# Patient Record
Sex: Female | Born: 1985 | Race: Black or African American | Hispanic: No | Marital: Single | Smoking: Former smoker
Health system: Southern US, Community
[De-identification: ages and names within clinical notes are randomized; demographics above are authoritative.]

## PROBLEM LIST (undated history)

## (undated) ENCOUNTER — Inpatient Hospital Stay (HOSPITAL_COMMUNITY): Payer: Self-pay

## (undated) DIAGNOSIS — D219 Benign neoplasm of connective and other soft tissue, unspecified: Secondary | ICD-10-CM

## (undated) DIAGNOSIS — F32A Depression, unspecified: Secondary | ICD-10-CM

## (undated) DIAGNOSIS — F329 Major depressive disorder, single episode, unspecified: Secondary | ICD-10-CM

## (undated) DIAGNOSIS — O210 Mild hyperemesis gravidarum: Secondary | ICD-10-CM

## (undated) HISTORY — PX: DILATION AND CURETTAGE OF UTERUS: SHX78

## (undated) HISTORY — DX: Major depressive disorder, single episode, unspecified: F32.9

## (undated) HISTORY — PX: THERAPEUTIC ABORTION: SHX798

## (undated) HISTORY — DX: Depression, unspecified: F32.A

---
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
## 2006-01-08 ENCOUNTER — Emergency Department (HOSPITAL_COMMUNITY): Admission: EM | Admit: 2006-01-08 | Discharge: 2006-01-09 | Payer: Self-pay | Admitting: Emergency Medicine

## 2006-01-10 ENCOUNTER — Ambulatory Visit: Payer: Self-pay | Admitting: Family Medicine

## 2006-01-13 ENCOUNTER — Inpatient Hospital Stay (HOSPITAL_COMMUNITY): Admission: AD | Admit: 2006-01-13 | Discharge: 2006-01-13 | Payer: Self-pay | Admitting: Family Medicine

## 2006-01-27 ENCOUNTER — Ambulatory Visit: Payer: Self-pay | Admitting: Family Medicine

## 2006-02-04 ENCOUNTER — Ambulatory Visit: Payer: Self-pay | Admitting: Family Medicine

## 2006-02-04 ENCOUNTER — Encounter (INDEPENDENT_AMBULATORY_CARE_PROVIDER_SITE_OTHER): Payer: Self-pay | Admitting: Specialist

## 2006-02-04 ENCOUNTER — Other Ambulatory Visit: Admission: RE | Admit: 2006-02-04 | Discharge: 2006-02-04 | Payer: Self-pay | Admitting: Family Medicine

## 2006-02-11 ENCOUNTER — Ambulatory Visit: Payer: Self-pay | Admitting: Family Medicine

## 2006-05-04 ENCOUNTER — Ambulatory Visit: Payer: Self-pay | Admitting: Family Medicine

## 2006-05-06 ENCOUNTER — Ambulatory Visit (HOSPITAL_COMMUNITY): Admission: RE | Admit: 2006-05-06 | Discharge: 2006-05-06 | Payer: Self-pay | Admitting: Obstetrics & Gynecology

## 2006-05-12 ENCOUNTER — Ambulatory Visit: Payer: Self-pay | Admitting: Family Medicine

## 2006-05-16 ENCOUNTER — Ambulatory Visit (HOSPITAL_COMMUNITY): Admission: RE | Admit: 2006-05-16 | Discharge: 2006-05-16 | Payer: Self-pay | Admitting: Family Medicine

## 2006-05-19 ENCOUNTER — Ambulatory Visit: Payer: Self-pay | Admitting: Family Medicine

## 2006-05-19 ENCOUNTER — Encounter (INDEPENDENT_AMBULATORY_CARE_PROVIDER_SITE_OTHER): Payer: Self-pay | Admitting: Family Medicine

## 2006-05-19 LAB — CONVERTED CEMR LAB
Antibody Screen: NEGATIVE
Eosinophils Relative: 1 % (ref 0–5)
HCT: 40.2 % (ref 36.0–46.0)
Hemoglobin: 13.4 g/dL (ref 12.0–15.0)
Hepatitis B Surface Ag: NEGATIVE
Lymphocytes Relative: 22 % (ref 12–46)
MCHC: 33.3 g/dL (ref 30.0–36.0)
Monocytes Absolute: 0.4 10*3/uL (ref 0.2–0.7)
Monocytes Relative: 7 % (ref 3–11)
RBC: 4.51 M/uL (ref 3.87–5.11)
RDW: 12.7 % (ref 11.5–14.0)
Rh Type: POSITIVE
hCG, Beta Chain, Quant, S: 42012.2 milliintl units/mL

## 2006-05-27 ENCOUNTER — Inpatient Hospital Stay (HOSPITAL_COMMUNITY): Admission: AD | Admit: 2006-05-27 | Discharge: 2006-05-27 | Payer: Self-pay | Admitting: Obstetrics

## 2006-07-12 ENCOUNTER — Inpatient Hospital Stay (HOSPITAL_COMMUNITY): Admission: AD | Admit: 2006-07-12 | Discharge: 2006-07-12 | Payer: Self-pay | Admitting: Obstetrics

## 2006-07-14 DIAGNOSIS — J45909 Unspecified asthma, uncomplicated: Secondary | ICD-10-CM | POA: Insufficient documentation

## 2007-04-27 ENCOUNTER — Emergency Department (HOSPITAL_COMMUNITY): Admission: EM | Admit: 2007-04-27 | Discharge: 2007-04-27 | Payer: Self-pay | Admitting: Emergency Medicine

## 2007-06-02 ENCOUNTER — Encounter: Payer: Self-pay | Admitting: *Deleted

## 2007-07-05 ENCOUNTER — Inpatient Hospital Stay (HOSPITAL_COMMUNITY): Admission: AD | Admit: 2007-07-05 | Discharge: 2007-07-05 | Payer: Self-pay | Admitting: Obstetrics

## 2007-10-05 ENCOUNTER — Emergency Department (HOSPITAL_COMMUNITY): Admission: EM | Admit: 2007-10-05 | Discharge: 2007-10-05 | Payer: Self-pay | Admitting: Family Medicine

## 2007-12-04 ENCOUNTER — Inpatient Hospital Stay (HOSPITAL_COMMUNITY): Admission: AD | Admit: 2007-12-04 | Discharge: 2007-12-04 | Payer: Self-pay | Admitting: Obstetrics & Gynecology

## 2007-12-06 ENCOUNTER — Inpatient Hospital Stay (HOSPITAL_COMMUNITY): Admission: AD | Admit: 2007-12-06 | Discharge: 2007-12-06 | Payer: Self-pay | Admitting: Family Medicine

## 2007-12-16 ENCOUNTER — Emergency Department (HOSPITAL_COMMUNITY): Admission: EM | Admit: 2007-12-16 | Discharge: 2007-12-16 | Payer: Self-pay | Admitting: Family Medicine

## 2008-04-03 ENCOUNTER — Emergency Department (HOSPITAL_COMMUNITY): Admission: EM | Admit: 2008-04-03 | Discharge: 2008-04-03 | Payer: Self-pay | Admitting: Emergency Medicine

## 2008-07-11 ENCOUNTER — Emergency Department (HOSPITAL_COMMUNITY): Admission: EM | Admit: 2008-07-11 | Discharge: 2008-07-11 | Payer: Self-pay | Admitting: Emergency Medicine

## 2008-07-24 ENCOUNTER — Emergency Department (HOSPITAL_COMMUNITY): Admission: EM | Admit: 2008-07-24 | Discharge: 2008-07-25 | Payer: Self-pay | Admitting: Emergency Medicine

## 2008-10-17 ENCOUNTER — Emergency Department (HOSPITAL_COMMUNITY): Admission: EM | Admit: 2008-10-17 | Discharge: 2008-10-17 | Payer: Self-pay | Admitting: Family Medicine

## 2010-06-07 ENCOUNTER — Encounter: Payer: Self-pay | Admitting: *Deleted

## 2010-06-11 ENCOUNTER — Emergency Department (HOSPITAL_COMMUNITY)
Admission: EM | Admit: 2010-06-11 | Discharge: 2010-06-11 | Payer: Self-pay | Source: Home / Self Care | Admitting: Family Medicine

## 2010-08-27 LAB — URINALYSIS, ROUTINE W REFLEX MICROSCOPIC
Nitrite: NEGATIVE
Specific Gravity, Urine: 1.027 (ref 1.005–1.030)
Urobilinogen, UA: 1 mg/dL (ref 0.0–1.0)
pH: 7 (ref 5.0–8.0)

## 2010-08-27 LAB — URINE MICROSCOPIC-ADD ON

## 2010-08-27 LAB — POCT I-STAT, CHEM 8
BUN: 10 mg/dL (ref 6–23)
Chloride: 104 mEq/L (ref 96–112)
Creatinine, Ser: 1 mg/dL (ref 0.4–1.2)
Glucose, Bld: 87 mg/dL (ref 70–99)
Hemoglobin: 12.6 g/dL (ref 12.0–15.0)
Potassium: 3.8 mEq/L (ref 3.5–5.1)
Sodium: 135 mEq/L (ref 135–145)

## 2010-08-27 LAB — URINE CULTURE: Colony Count: 35000

## 2010-09-30 ENCOUNTER — Inpatient Hospital Stay (INDEPENDENT_AMBULATORY_CARE_PROVIDER_SITE_OTHER)
Admission: RE | Admit: 2010-09-30 | Discharge: 2010-09-30 | Disposition: A | Discharge status: Home / Self Care | Payer: Self-pay | Source: Ambulatory Visit | Attending: Family Medicine | Admitting: Family Medicine

## 2010-09-30 DIAGNOSIS — S335XXA Sprain of ligaments of lumbar spine, initial encounter: Secondary | ICD-10-CM

## 2010-11-11 ENCOUNTER — Inpatient Hospital Stay (INDEPENDENT_AMBULATORY_CARE_PROVIDER_SITE_OTHER)
Admission: RE | Admit: 2010-11-11 | Discharge: 2010-11-11 | Disposition: A | Discharge status: Home / Self Care | Payer: Self-pay | Source: Ambulatory Visit | Attending: Emergency Medicine | Admitting: Emergency Medicine

## 2010-11-11 DIAGNOSIS — T148 Other injury of unspecified body region: Secondary | ICD-10-CM

## 2010-12-11 ENCOUNTER — Emergency Department (HOSPITAL_COMMUNITY)
Admission: EM | Admit: 2010-12-11 | Discharge: 2010-12-11 | Disposition: A | Discharge status: Home / Self Care | Payer: Medicaid Other | Attending: Emergency Medicine | Admitting: Emergency Medicine

## 2010-12-11 DIAGNOSIS — R109 Unspecified abdominal pain: Secondary | ICD-10-CM | POA: Insufficient documentation

## 2010-12-11 DIAGNOSIS — N898 Other specified noninflammatory disorders of vagina: Secondary | ICD-10-CM | POA: Insufficient documentation

## 2010-12-11 DIAGNOSIS — J45909 Unspecified asthma, uncomplicated: Secondary | ICD-10-CM | POA: Insufficient documentation

## 2010-12-11 LAB — URINALYSIS, ROUTINE W REFLEX MICROSCOPIC
Glucose, UA: NEGATIVE mg/dL
Ketones, ur: NEGATIVE mg/dL
Protein, ur: NEGATIVE mg/dL
Urobilinogen, UA: 1 mg/dL (ref 0.0–1.0)

## 2010-12-11 LAB — WET PREP, GENITAL: Yeast Wet Prep HPF POC: NONE SEEN

## 2010-12-11 LAB — URINE MICROSCOPIC-ADD ON

## 2010-12-11 LAB — POCT I-STAT, CHEM 8
BUN: 16 mg/dL (ref 6–23)
Chloride: 107 mEq/L (ref 96–112)
Creatinine, Ser: 1.2 mg/dL — ABNORMAL HIGH (ref 0.50–1.10)
Glucose, Bld: 93 mg/dL (ref 70–99)
HCT: 40 % (ref 36.0–46.0)
Potassium: 4 mEq/L (ref 3.5–5.1)
Sodium: 139 mEq/L (ref 135–145)

## 2010-12-11 LAB — POCT PREGNANCY, URINE: Preg Test, Ur: NEGATIVE

## 2010-12-14 LAB — GC/CHLAMYDIA PROBE AMP, GENITAL
Chlamydia, DNA Probe: UNDETERMINED
GC Probe Amp, Genital: NEGATIVE

## 2011-02-05 LAB — URINALYSIS, ROUTINE W REFLEX MICROSCOPIC
Nitrite: NEGATIVE
Specific Gravity, Urine: 1.02
pH: 8

## 2011-02-12 LAB — URINALYSIS, ROUTINE W REFLEX MICROSCOPIC
Bilirubin Urine: NEGATIVE
Hgb urine dipstick: NEGATIVE
Ketones, ur: NEGATIVE
Nitrite: NEGATIVE
Urobilinogen, UA: 0.2

## 2011-02-12 LAB — POCT PREGNANCY, URINE
Operator id: 129211
Preg Test, Ur: NEGATIVE

## 2011-02-12 LAB — GC/CHLAMYDIA PROBE AMP, GENITAL
Chlamydia, DNA Probe: NEGATIVE
GC Probe Amp, Genital: NEGATIVE

## 2011-02-12 LAB — WET PREP, GENITAL

## 2011-02-22 LAB — POCT PREGNANCY, URINE: Preg Test, Ur: NEGATIVE

## 2011-02-22 LAB — URINE MICROSCOPIC-ADD ON

## 2011-02-22 LAB — URINALYSIS, ROUTINE W REFLEX MICROSCOPIC
Hgb urine dipstick: NEGATIVE
Ketones, ur: NEGATIVE
Protein, ur: NEGATIVE
Urobilinogen, UA: 1

## 2011-05-18 NOTE — L&D Delivery Note (Signed)
Delivery Note At 4:20 PM a viable female was delivered via Vaginal, Spontaneous Delivery (Presentation: ;  ).  APGAR: , ; weight .   Placenta status: , .  Cord:  with the following complications: .  Cord pH: not done  Anesthesia: Epidural  Episiotomy:  Lacerations:  Suture Repair: 2.0 Est. Blood Loss (mL):   Mom to postpartum.  Baby to nursery-stable.  Maryana Pittmon A 04/08/2012, 4:28 PM

## 2011-08-15 ENCOUNTER — Encounter (HOSPITAL_COMMUNITY): Payer: Self-pay | Admitting: *Deleted

## 2011-08-15 ENCOUNTER — Emergency Department (HOSPITAL_COMMUNITY)
Admission: EM | Admit: 2011-08-15 | Discharge: 2011-08-15 | Disposition: A | Discharge status: Home / Self Care | Payer: Self-pay | Source: Home / Self Care | Attending: Emergency Medicine | Admitting: Emergency Medicine

## 2011-08-15 DIAGNOSIS — Z331 Pregnant state, incidental: Secondary | ICD-10-CM

## 2011-08-15 DIAGNOSIS — R1110 Vomiting, unspecified: Secondary | ICD-10-CM

## 2011-08-15 LAB — POCT I-STAT, CHEM 8
Chloride: 105 mEq/L (ref 96–112)
Glucose, Bld: 90 mg/dL (ref 70–99)
HCT: 43 % (ref 36.0–46.0)
Hemoglobin: 14.6 g/dL (ref 12.0–15.0)
Potassium: 4.2 mEq/L (ref 3.5–5.1)
Sodium: 139 mEq/L (ref 135–145)

## 2011-08-15 LAB — POCT PREGNANCY, URINE: Preg Test, Ur: POSITIVE — AB

## 2011-08-15 MED ORDER — PRENATAL AD PO TABS: 1.0000 | ORAL_TABLET | Freq: Every day | ORAL | Status: DC

## 2011-08-15 NOTE — Discharge Instructions (Signed)
AS. Discuss, and to followup with women's clinic for your pregnancy he would also need to have this test was done to recheck your kidney function is to cut discussed as his mildly abnormal. Have encouraged you to drink abundant fluids as I think you're     most likely chronic dehydration has become more important and symptomatic as you are pregnant now and it's imperative that you drink abundant fluids as discussed should also start taking prenatal vitamins as well as possible to establish prenatal care.  If any abdominal pain, recurrent or repeated vomiting, vaginal bleeding or spotting should go to [facility] ABCs of Pregnancy A Antepartum care is very important. Be sure you see your doctor and get prenatal care as soon as you think you are pregnant. At this time, you will be tested for infection, genetic abnormalities and potential problems with you and the pregnancy. This is the time to discuss diet, exercise, work, medications, labor, pain medication during labor and the possibility of a cesarean delivery. Ask any questions that may concern you. It is important to see your doctor regularly throughout your pregnancy. Avoid exposure to toxic substances and chemicals - such as cleaning solvents, lead and mercury, some insecticides, and paint. Pregnant women should avoid exposure to paint fumes, and fumes that cause you to feel ill, dizzy or faint. When possible, it is a good idea to have a pre-pregnancy consultation with your caregiver to begin some important recommendations your caregiver suggests such as, taking folic acid, exercising, quitting smoking, avoiding alcoholic beverages, etc. B Breastfeeding is the healthiest choice for both you and your baby. It has many nutritional benefits for the baby and health benefits for the mother. It also creates a very tight and loving bond between the baby and mother. Talk to your doctor, your family and friends, and your employer about how you choose to  feed your baby and how they can support you in your decision. Not all birth defects can be prevented, but a woman can take actions that may increase her chance of having a healthy baby. Many birth defects happen very early in pregnancy, sometimes before a woman even knows she is pregnant. Birth defects or abnormalities of any child in your or the father's family should be discussed with your caregiver. Get a good support bra as your breast size changes. Wear it especially when you exercise and when nursing.  C Celebrate the news of your pregnancy with the your spouse/father and family. Childbirth classes are helpful to take for you and the spouse/father because it helps to understand what happens during the pregnancy, labor and delivery. Cesarean delivery should be discussed with your doctor so you are prepared for that possibility. The pros and cons of circumcision if it is a boy, should be discussed with your pediatrician. Cigarette smoking during pregnancy can result in low birth weight babies. It has been associated with infertility, miscarriages, tubal pregnancies, infant death (mortality) and poor health (morbidity) in childhood. Additionally, cigarette smoking may cause long-term learning disabilities. If you smoke, you should try to quit before getting pregnant and not smoke during the pregnancy. Secondary smoke may also harm a mother and her developing baby. It is a good idea to ask people to stop smoking around you during your pregnancy and after the baby is born. Extra calcium is necessary when you are pregnant and is found in your prenatal vitamin, in dairy products, green leafy vegetables and in calcium supplements. D A healthy diet according to  your current weight and height, along with vitamins and mineral supplements should be discussed with your caregiver. Domestic abuse or violence should be made known to your doctor right away to get the situation corrected. Drink more water when you exercise  to keep hydrated. Discomfort of your back and legs usually develops and progresses from the middle of the second trimester through to delivery of the baby. This is because of the enlarging baby and uterus, which may also affect your balance. Do not take illegal drugs. Illegal drugs can seriously harm the baby and you. Drink extra fluids (water is best) throughout pregnancy to help your body keep up with the increases in your blood volume. Drink at least 6 to 8 glasses of water, fruit juice, or milk each day. A good way to know you are drinking enough fluid is when your urine looks almost like clear water or is very light yellow.  E Eat healthy to get the nutrients you and your unborn baby need. Your meals should include the five basic food groups. Exercise (30 minutes of light to moderate exercise a day) is important and encouraged during pregnancy, if there are no medical problems or problems with the pregnancy. Exercise that causes discomfort or dizziness should be stopped and reported to your caregiver. Emotions during pregnancy can change from being ecstatic to depression and should be understood by you, your partner and your family. F Fetal screening with ultrasound, amniocentesis and monitoring during pregnancy and labor is common and sometimes necessary. Take 400 micrograms of folic acid daily both before, when possible, and during the first few months of pregnancy to reduce the risk of birth defects of the brain and spine. All women who could possibly become pregnant should take a vitamin with folic acid, every day. It is also important to eat a healthy diet with fortified foods (enriched grain products, including cereals, rice, breads, and pastas) and foods with natural sources of folate (orange juice, green leafy vegetables, beans, peanuts, broccoli, asparagus, peas, and lentils). The father should be involved with all aspects of the pregnancy including, the prenatal care, childbirth classes, labor,  delivery, and postpartum time. Fathers may also have emotional concerns about being a father, financial needs, and raising a family. G Genetic testing should be done appropriately. It is important to know your family and the father's history. If there have been problems with pregnancies or birth defects in your family, report these to your doctor. Also, genetic counselors can talk with you about the information you might need in making decisions about having a family. You can call a major medical center in your area for help in finding a board-certified genetic counselor. Genetic testing and counseling should be done before pregnancy when possible, especially if there is a history of problems in the mother's or father's family. Certain ethnic backgrounds are more at risk for genetic defects. H Get familiar with the hospital where you will be having your baby. Get to know how long it takes to get there, the labor and delivery area, and the hospital procedures. Be sure your medical insurance is accepted there. Get your home ready for the baby including, clothes, the baby's room (when possible), furniture and car seat. Hand washing is important throughout the day, especially after handling raw meat and poultry, changing the baby's diaper or using the bathroom. This can help prevent the spread of many bacteria and viruses that cause infection. Your hair may become dry and thinner, but will return to normal  a few weeks after the baby is born. Heartburn is a common problem that can be treated by taking antacids recommended by your caregiver, eating smaller meals 5 or 6 times a day, not drinking liquids when eating, drinking between meals and raising the head of your bed 2 to 3 inches. I Insurance to cover you, the baby, doctor and hospital should be reviewed so that you will be prepared to pay any costs not covered by your insurance plan. If you do not have medical insurance, there are usually clinics and services  available for you in your community. Take 30 milligrams of iron during your pregnancy as prescribed by your doctor to reduce the risk of low red blood cells (anemia) later in pregnancy. All women of childbearing age should eat a diet rich in iron. J There should be a joint effort for the mother, father and any other children to adapt to the pregnancy financially, emotionally, and psychologically during the pregnancy. Join a support group for moms-to-be. Or, join a class on parenting or childbirth. Have the family participate when possible. K Know your limits. Let your caregiver know if you experience any of the following:   Pain of any kind.   Strong cramps.   You develop a lot of weight in a short period of time (5 pounds in 3 to 5 days).   Vaginal bleeding, leaking of amniotic fluid.   Headache, vision problems.   Dizziness, fainting, shortness of breath.   Chest pain.   Fever of 102 F (38.9 C) or higher.   Gush of clear fluid from your vagina.   Painful urination.   Domestic violence.   Irregular heartbeat (palpitations).   Rapid beating of the heart (tachycardia).   Constant feeling sick to your stomach (nauseous) and vomiting.   Trouble walking, fluid retention (edema).   Muscle weakness.   If your baby has decreased activity.   Persistent diarrhea.   Abnormal vaginal discharge.   Uterine contractions at 20-minute intervals.   Back pain that travels down your leg.  L Learn and practice that what you eat and drink should be in moderation and healthy for you and your baby. Legal drugs such as alcohol and caffeine are important issues for pregnant women. There is no safe amount of alcohol a woman can drink while pregnant. Fetal alcohol syndrome, a disorder characterized by growth retardation, facial abnormalities, and central nervous system dysfunction, is caused by a woman's use of alcohol during pregnancy. Caffeine, found in tea, coffee, soft drinks and  chocolate, should also be limited. Be sure to read labels when trying to cut down on caffeine during pregnancy. More than 200 foods, beverages, and over-the-counter medications contain caffeine and have a high salt content! There are coffees and teas that do not contain caffeine. M Medical conditions such as diabetes, epilepsy, and high blood pressure should be treated and kept under control before pregnancy when possible, but especially during pregnancy. Ask your caregiver about any medications that may need to be changed or adjusted during pregnancy. If you are currently taking any medications, ask your caregiver if it is safe to take them while you are pregnant or before getting pregnant when possible. Also, be sure to discuss any herbs or vitamins you are taking. They are medicines, too! Discuss with your doctor all medications, prescribed and over-the-counter, that you are taking. During your prenatal visit, discuss the medications your doctor may give you during labor and delivery. N Never be afraid to ask your  doctor or caregiver questions about your health, the progress of the pregnancy, family problems, stressful situations, and recommendation for a pediatrician, if you do not have one. It is better to take all precautions and discuss any questions or concerns you may have during your office visits. It is a good idea to write down your questions before you visit the doctor. O Over-the-counter cough and cold remedies may contain alcohol or other ingredients that should be avoided during pregnancy. Ask your caregiver about prescription, herbs or over-the-counter medications that you are taking or may consider taking while pregnant.  P Physical activity during pregnancy can benefit both you and your baby by lessening discomfort and fatigue, providing a sense of well-being, and increasing the likelihood of early recovery after delivery. Light to moderate exercise during pregnancy strengthens the belly  (abdominal) and back muscles. This helps improve posture. Practicing yoga, walking, swimming, and cycling on a stationary bicycle are usually safe exercises for pregnant women. Avoid scuba diving, exercise at high altitudes (over 3000 feet), skiing, horseback riding, contact sports, etc. Always check with your doctor before beginning any kind of exercise, especially during pregnancy and especially if you did not exercise before getting pregnant. Q Queasiness, stomach upset and morning sickness are common during pregnancy. Eating a couple of crackers or dry toast before getting out of bed. Foods that you normally love may make you feel sick to your stomach. You may need to substitute other nutritious foods. Eating 5 or 6 small meals a day instead of 3 large ones may make you feel better. Do not drink with your meals, drink between meals. Questions that you have should be written down and asked during your prenatal visits. R Read about and make plans to baby-proof your home. There are important tips for making your home a safer environment for your baby. Review the tips and make your home safer for you and your baby. Read food labels regarding calories, salt and fat content in the food. S Saunas, hot tubs, and steam rooms should be avoided while you are pregnant. Excessive high heat may be harmful during your pregnancy. Your caregiver will screen and examine you for sexually transmitted diseases and genetic disorders during your prenatal visits. Learn the signs of labor. Sexual relations while pregnant is safe unless there is a medical or pregnancy problem and your caregiver advises against it. T Traveling long distances should be avoided especially in the third trimester of your pregnancy. If you do have to travel out of state, be sure to take a copy of your medical records and medical insurance plan with you. You should not travel long distances without seeing your doctor first. Most airlines will not allow  you to travel after 36 weeks of pregnancy. Toxoplasmosis is an infection caused by a parasite that can seriously harm an unborn baby. Avoid eating undercooked meat and handling cat litter. Be sure to wear gloves when gardening. Tingling of the hands and fingers is not unusual and is due to fluid retention. This will go away after the baby is born. U Womb (uterus) size increases during the first trimester. Your kidneys will begin to function more efficiently. This may cause you to feel the need to urinate more often. You may also leak urine when sneezing, coughing or laughing. This is due to the growing uterus pressing against your bladder, which lies directly in front of and slightly under the uterus during the first few months of pregnancy. If you experience burning along with  frequency of urination or bloody urine, be sure to tell your doctor. The size of your uterus in the third trimester may cause a problem with your balance. It is advisable to maintain good posture and avoid wearing high heels during this time. An ultrasound of your baby may be necessary during your pregnancy and is safe for you and your baby. V Vaccinations are an important concern for pregnant women. Get needed vaccines before pregnancy. Center for Disease Control ([employer]) has clear guidelines for the use of vaccines during pregnancy. Review the list, be sure to discuss it with your doctor. Prenatal vitamins are helpful and healthy for you and the baby. Do not take extra vitamins except what is recommended. Taking too much of certain vitamins can cause overdose problems. Continuous vomiting should be reported to your caregiver. Varicose veins may appear especially if there is a family history of varicose veins. They should subside after the delivery of the baby. Support hose helps if there is leg discomfort. W Being overweight or underweight during pregnancy may cause problems. Try to get within 15 pounds of your ideal weight  before pregnancy. Remember, pregnancy is not a time to be dieting! Do not stop eating or start skipping meals as your weight increases. Both you and your baby need the calories and nutrition you receive from a healthy diet. Be sure to consult with your doctor about your diet. There is a formula and diet plan available depending on whether you are overweight or underweight. Your caregiver or nutritionist can help and advise you if necessary. X Avoid X-rays. If you must have dental work or diagnostic tests, tell your dentist or physician that you are pregnant so that extra care can be taken. X-rays should only be taken when the risks of not taking them outweigh the risk of taking them. If needed, only the minimum amount of radiation should be used. When X-rays are necessary, protective lead shields should be used to cover areas of the body that are not being X-rayed. Y Your baby loves you. Breastfeeding your baby creates a loving and very close bond between the two of you. Give your baby a healthy environment to live in while you are pregnant. Infants and children require constant care and guidance. Their health and safety should be carefully watched at all times. After the baby is born, rest or take a nap when the baby is sleeping. Z Get your ZZZs. Be sure to get plenty of rest. Resting on your side as often as possible, especially on your left side is advised. It provides the best circulation to your baby and helps reduce swelling. Try taking a nap for 30 to 45 minutes in the afternoon when possible. After the baby is born rest or take a nap when the baby is sleeping. Try elevating your feet for that amount of time when possible. It helps the circulation in your legs and helps reduce swelling.  Most information courtesy of the CDC. Document Released: 05/03/2005 Document Revised: 04/22/2011 Document Reviewed: 01/15/2009 [facility] Patient Information 2012 [place], [place].

## 2011-08-15 NOTE — ED Notes (Signed)
Pt with c/o headaches onset yesterday - intermittent headache - felt balance off last night - onset of vomiting this am - vomited x 2

## 2011-08-15 NOTE — ED Provider Notes (Addendum)
History     CSN: [id number]  Arrival date & time 08/15/11  1735   First MD Initiated Contact with Patient 08/15/11 1747      Chief Complaint  Patient presents with  . Headache    (Consider location/radiation/quality/duration/timing/severity/associated sxs/prior treatment) HPI Comments: Patient presents to the urgent care since yesterday she's been having a headache and also refill a bit dizzy especially when she stands up suddenly. Have also vomited 2 times in the last 24 hours. Patient denies any other symptoms such as pelvic pain, vaginal bleeding   Patient is a 26 y.o. female presenting with headaches. The history is provided by the patient.  Headache The primary symptoms include headaches, dizziness, nausea and vomiting. Primary symptoms do not include syncope, loss of consciousness, altered mental status, visual change, paresthesias, memory loss or fever. The symptoms began 2 days ago. The symptoms are unchanged.  The headache is not associated with visual change or paresthesias.  Dizziness also occurs with nausea and vomiting.    Past Medical History  Diagnosis Date  . Asthma     History reviewed. No pertinent past surgical history.  History reviewed. No pertinent family history.  History  Substance Use Topics  . Smoking status: Never Smoker   . Smokeless tobacco: Not on file  . Alcohol Use: No    OB History    Grav Para Term Preterm Abortions TAB SAB Ect Mult Living                  Review of Systems  Constitutional: Positive for appetite change and fatigue. Negative for fever.  Cardiovascular: Negative for syncope.  Gastrointestinal: Positive for nausea and vomiting.  Genitourinary: Negative for dysuria, vaginal bleeding, menstrual problem and pelvic pain.  Neurological: Positive for dizziness and headaches. Negative for loss of consciousness and paresthesias.  Psychiatric/Behavioral: Negative for memory loss and altered mental status.    Allergies    Penicillins and Tylenol  Home Medications   Current Outpatient Rx  Name Route Sig Dispense Refill  . ALBUTEROL SULFATE HFA 108 (90 BASE) MCG/ACT IN AERS Inhalation Inhale 2 puffs into the lungs every 6 (six) hours as needed.    . IBUPROFEN 200 MG PO TABS Oral Take 400 mg by mouth every 6 (six) hours as needed.      BP 112/67  Pulse 74  Temp(Src) 98.7 F (37.1 C) (Oral)  Resp 18  SpO2 98%  LMP 07/06/2011  Physical Exam  Nursing note and vitals reviewed. Constitutional: She appears well-developed and well-nourished. No distress.  Cardiovascular: Normal rate.   Pulmonary/Chest: Effort normal and breath sounds normal. No respiratory distress.  Abdominal: Bowel sounds are normal. She exhibits no distension. There is no rebound and no guarding.  Skin: No rash noted.    ED Course  Procedures (including critical care time)  Labs Reviewed  POCT PREGNANCY, URINE - Abnormal; Notable for the following:    Preg Test, Ur POSITIVE (*)    All other components within normal limits  POCT I-STAT, CHEM 8 - Abnormal; Notable for the following:    Creatinine, Ser 1.20 (*)    All other components within normal limits   No results found.   No diagnosis found.    MDM  Patient complains of her headache since yesterday was feeling a little bit lightheaded had one episode of vomiting this a.m. Patient is pregnant exam is remarkable otherwise        Jimmie Molly, MD 08/15/11 1906  Jimmie Molly, MD 08/15/11  1907 

## 2011-08-19 ENCOUNTER — Inpatient Hospital Stay (HOSPITAL_COMMUNITY): Payer: Medicaid Other

## 2011-08-19 ENCOUNTER — Inpatient Hospital Stay (HOSPITAL_COMMUNITY)
Admission: AD | Admit: 2011-08-19 | Discharge: 2011-08-19 | Disposition: A | Discharge status: Home / Self Care | Payer: Medicaid Other | Source: Ambulatory Visit | Attending: Obstetrics & Gynecology | Admitting: Obstetrics & Gynecology

## 2011-08-19 ENCOUNTER — Encounter (HOSPITAL_COMMUNITY): Payer: Self-pay

## 2011-08-19 DIAGNOSIS — O210 Mild hyperemesis gravidarum: Secondary | ICD-10-CM | POA: Insufficient documentation

## 2011-08-19 HISTORY — DX: Mild hyperemesis gravidarum: O21.0

## 2011-08-19 LAB — DIFFERENTIAL
Basophils Absolute: 0 10*3/uL (ref 0.0–0.1)
Eosinophils Absolute: 0 10*3/uL (ref 0.0–0.7)
Eosinophils Relative: 0 % (ref 0–5)
Lymphocytes Relative: 23 % (ref 12–46)
Monocytes Absolute: 0.6 10*3/uL (ref 0.1–1.0)

## 2011-08-19 LAB — URINALYSIS, ROUTINE W REFLEX MICROSCOPIC
Leukocytes, UA: NEGATIVE
Nitrite: NEGATIVE
Specific Gravity, Urine: >1.03 — ABNORMAL HIGH (ref 1.005–1.030)
Urobilinogen, UA: 1 mg/dL (ref 0.0–1.0)
pH: 6 (ref 5.0–8.0)

## 2011-08-19 LAB — URINE MICROSCOPIC-ADD ON

## 2011-08-19 LAB — WET PREP, GENITAL
Trich, Wet Prep: NONE SEEN
Yeast Wet Prep HPF POC: NONE SEEN

## 2011-08-19 LAB — CBC
HCT: 42.1 % (ref 36.0–46.0)
MCH: 30.7 pg (ref 26.0–34.0)
MCV: 89.2 fL (ref 78.0–100.0)
Platelets: 268 10*3/uL (ref 150–400)
RDW: 11.8 % (ref 11.5–15.5)
WBC: 8.1 10*3/uL (ref 4.0–10.5)

## 2011-08-19 LAB — HCG, QUANTITATIVE, PREGNANCY: hCG, Beta Chain, Quant, S: 17595 m[IU]/mL — ABNORMAL HIGH (ref <5)

## 2011-08-19 MED ORDER — LACTATED RINGERS IV BOLUS (SEPSIS)
1000.0000 mL | Freq: Once | INTRAVENOUS | Status: AC
  Administered 2011-08-19: 1000 mL via INTRAVENOUS

## 2011-08-19 MED ORDER — PROMETHAZINE HCL 25 MG/ML IJ SOLN
12.5000 mg | Freq: Once | INTRAMUSCULAR | Status: AC
  Administered 2011-08-19: 12.5 mg via INTRAVENOUS
  Filled 2011-08-19: qty 1

## 2011-08-19 MED ORDER — ONDANSETRON HCL 4 MG/2ML IJ SOLN
4.0000 mg | Freq: Once | INTRAMUSCULAR | Status: AC
  Administered 2011-08-19: 4 mg via INTRAVENOUS
  Filled 2011-08-19: qty 2

## 2011-08-19 MED ORDER — PROMETHAZINE HCL 25 MG RE SUPP: 25.0000 mg | Freq: Four times a day (QID) | RECTAL | Status: DC | PRN

## 2011-08-19 MED ORDER — PROMETHAZINE HCL 25 MG PO TABS: 12.5000 mg | ORAL_TABLET | Freq: Four times a day (QID) | ORAL | Status: DC | PRN

## 2011-08-19 MED ORDER — THIAMINE HCL 100 MG/ML IJ SOLN
Freq: Once | INTRAVENOUS | Status: AC
  Administered 2011-08-19: 15:00:00 via INTRAVENOUS
  Filled 2011-08-19: qty 1000

## 2011-08-19 NOTE — MAU Note (Signed)
No adverse effect from phenergan, still feeling slight nausea. Pt resting.

## 2011-08-19 NOTE — MAU Note (Signed)
Patient states she has had nausea and vomiting for 4 days, for the past 3 has been unable to keep anything down. Lower abdominal cramping. Had a positive pregnancy test at Urgent Care on 3-31.

## 2011-08-19 NOTE — MAU Provider Note (Signed)
History     CSN: [id number]  Arrival date & time 08/19/11  1020   None     Chief Complaint  Patient presents with  . Emesis During Pregnancy    HPI Jean Ali is a 26 y.o. female @ [redacted]w[redacted]d gestation who presents to MAU for nausea and vomiting. Unable to keep anything down for the past 4 days. Occasional low abdominal cramping. The history was provided by the patient.  Past Medical History  Diagnosis Date  . Asthma   . Hyperemesis gravidarum     Past Surgical History  Procedure Date  . Dilation and curettage of uterus     Family History  Problem Relation Age of Onset  . Anesthesia problems Neg Hx   . Hypotension Neg Hx   . Malignant hyperthermia Neg Hx   . Pseudochol deficiency Neg Hx     History  Substance Use Topics  . Smoking status: Former Games developer  . Smokeless tobacco: Never Used  . Alcohol Use: No     stopped once she found out she was pregnant    OB History    Grav Para Term Preterm Abortions TAB SAB Ect Mult Living   4 1 1  2 2    1       Review of Systems  Constitutional: Positive for appetite change and fatigue. Negative for fever and chills.  Eyes: Negative.   Respiratory: Negative.   Cardiovascular: Negative.   Gastrointestinal: Positive for nausea, vomiting, abdominal pain and constipation. Negative for diarrhea.  Genitourinary: Positive for vaginal discharge. Negative for dysuria, urgency, frequency, vaginal bleeding, difficulty urinating and pelvic pain.  Musculoskeletal: Negative for back pain.  Skin: Negative.   Neurological: Positive for headaches. Negative for dizziness.  Psychiatric/Behavioral: Negative for confusion. The patient is not nervous/anxious.     Allergies  Penicillins and Tylenol  Home Medications   Current Outpatient Rx  Name Route Sig Dispense Refill  . PROMETHAZINE HCL 25 MG RE SUPP Rectal Place 1 suppository (25 mg total) rectally every 6 (six) hours as needed for nausea. 12 each 0  . PROMETHAZINE HCL 25 MG PO  TABS Oral Take 0.5 tablets (12.5 mg total) by mouth every 6 (six) hours as needed for nausea. 20 tablet 0    BP 127/72  Pulse 82  Temp(Src) 98.5 F (36.9 C) (Oral)  Resp 16  Ht 5\' 5"  (1.651 m)  Wt 135 lb (61.236 kg)  BMI 22.47 kg/m2  SpO2 100%  LMP 07/06/2011  Physical Exam  Nursing note and vitals reviewed. Constitutional: She is oriented to person, place, and time. She appears well-developed and well-nourished. No distress.  HENT:  Head: Normocephalic.  Eyes: EOM are normal.  Neck: Neck supple.  Cardiovascular: Normal rate.   Pulmonary/Chest: Effort normal.  Abdominal: Soft. There is no tenderness.  Genitourinary:       External genitalia without lesions. White discharge vaginal vault, Cervix closed, no CMT, no adnexal tenderness. Uterus slightly enlarged.  Musculoskeletal: Normal range of motion.  Neurological: She is alert and oriented to person, place, and time. No cranial nerve deficit.  Skin: Skin is warm and dry.  Psychiatric: She has a normal mood and affect. Her behavior is normal. Judgment and thought content normal.   Results for orders placed during the hospital encounter of 08/19/11 (from the past 24 hour(s))  URINALYSIS, ROUTINE W REFLEX MICROSCOPIC     Status: Abnormal   Collection Time   08/19/11 10:25 AM      Component Value Range  Color, Urine YELLOW  YELLOW    APPearance CLEAR  CLEAR    Specific Gravity, Urine >1.030 (*) 1.005 - 1.030    pH 6.0  5.0 - 8.0    Glucose, UA NEGATIVE  NEGATIVE (mg/dL)   Hgb urine dipstick NEGATIVE  NEGATIVE    Bilirubin Urine NEGATIVE  NEGATIVE    Ketones, ur NEGATIVE  NEGATIVE (mg/dL)   Protein, ur 30 (*) NEGATIVE (mg/dL)   Urobilinogen, UA 1.0  0.0 - 1.0 (mg/dL)   Nitrite NEGATIVE  NEGATIVE    Leukocytes, UA NEGATIVE  NEGATIVE   URINE MICROSCOPIC-ADD ON     Status: Normal   Collection Time   08/19/11 10:25 AM      Component Value Range   Squamous Epithelial / LPF RARE  RARE    WBC, UA 3-6  <3 (WBC/hpf)   RBC / HPF  0-2  <3 (RBC/hpf)   Bacteria, UA RARE  RARE    Urine-Other MUCOUS PRESENT    CBC     Status: Normal   Collection Time   08/19/11 11:51 AM      Component Value Range   WBC 8.1  4.0 - 10.5 (K/uL)   RBC 4.72  3.87 - 5.11 (MIL/uL)   Hemoglobin 14.5  12.0 - 15.0 (g/dL)   HCT 14.7  82.9 - 56.2 (%)   MCV 89.2  78.0 - 100.0 (fL)   MCH 30.7  26.0 - 34.0 (pg)   MCHC 34.4  30.0 - 36.0 (g/dL)   RDW 13.0  86.5 - 78.4 (%)   Platelets 268  150 - 400 (K/uL)  DIFFERENTIAL     Status: Normal   Collection Time   08/19/11 11:51 AM      Component Value Range   Neutrophils Relative 70  43 - 77 (%)   Neutro Abs 5.7  1.7 - 7.7 (K/uL)   Lymphocytes Relative 23  12 - 46 (%)   Lymphs Abs 1.8  0.7 - 4.0 (K/uL)   Monocytes Relative 7  3 - 12 (%)   Monocytes Absolute 0.6  0.1 - 1.0 (K/uL)   Eosinophils Relative 0  0 - 5 (%)   Eosinophils Absolute 0.0  0.0 - 0.7 (K/uL)   Basophils Relative 0  0 - 1 (%)   Basophils Absolute 0.0  0.0 - 0.1 (K/uL)  HCG, QUANTITATIVE, PREGNANCY     Status: Abnormal   Collection Time   08/19/11 11:51 AM      Component Value Range   hCG, Beta Chain, Quant, S 17595 (*) <5 (mIU/mL)  ABO/RH     Status: Normal   Collection Time   08/19/11 11:51 AM      Component Value Range   ABO/RH(D) O POS    WET PREP, GENITAL     Status: Abnormal   Collection Time   08/19/11  4:25 PM      Component Value Range   Yeast Wet Prep HPF POC NONE SEEN  NONE SEEN    Trich, Wet Prep NONE SEEN  NONE SEEN    Clue Cells Wet Prep HPF POC MODERATE (*) NONE SEEN    WBC, Wet Prep HPF POC FEW (*) NONE SEEN     Ultrasound shows a 6 week 4 day IUP with YS and FP.  ED Course: IV hydration and antiemetics  Procedures  Patient feeling better after fluids, taking po fluids without difficulty.   Assessment: Hyperemesis  Plan:  Phenergan Supp. Rx   Phenergan Tablet Rx   Start  prenatal care   Return as needed MDM

## 2011-08-19 NOTE — MAU Note (Signed)
Pt still feeling nauseated.

## 2011-08-19 NOTE — MAU Note (Signed)
No adverse effect from zofran, slight relief in nausea.

## 2011-08-19 NOTE — Discharge Instructions (Signed)
  ________________________________________     To schedule your Maternity Eligibility Appointment, please call [phone].  When you arrive for your appointment you must bring the following items or information listed below.  Your appointment will be rescheduled if you do not have these items or are 15 minutes late. If currently receiving Medicaid, you MUST bring: 1. Medicaid Card 2. Social Security Card 3. Picture ID 4. Proof of Pregnancy 5. Verification of current address if the address on Medicaid card is incorrect "postmarked mail" If not receiving Medicaid, you MUST bring: 1. Social Security Card 2. Picture ID 3. Birth Certificate (if available) Passport or *Green Card 4. Proof of Pregnancy 5. Verification of current address "postmarked mail" for each income presented. 6. Verification of insurance coverage, if any 7. Check stubs from each employer for the previous month (if unable to present check stub  for each week, we will accept check stub for the first and last week ill the same month.) If you can't locate check stubs, you must bring a letter from the employer(s) and it must have the following information on letterhead, typed, in English: o name of company o company telephone number o how long been with the company, if less than one month o how much person earns per hour o how many hours per week work o the gross pay the person earned for the previous month If you are 25 years old or less, you do not have to bring proof of income unless you work or live with the father of the baby and at that time we will need proof of income from you and/or the father of the baby. Green Card recipients are eligible for Medicaid for Pregnant Women (MPW)     Hyperemesis Gravidarum Diet Hyperemesis gravidarum is a severe form of morning sickness. It is characterized by frequent and severe vomiting. It happens during the first trimester of pregnancy. It may be caused by the rapid hormone  changes that happen during pregnancy. It is associated with a 5% weight loss of pre-pregnancy weight. The hyperemesis diet may be used to lessen symptoms of nausea and vomiting. EATING GUIDELINES  Eat 5 to 6 small meals daily instead of 3 large meals.   Avoid foods with strong smells.   Avoid drinking 30 minutes before and after meals.   Avoid fried or high-fat foods, such as butter and cream sauces.   Starchy foods are usually well-tolerated, such as cereal, toast, bread, potatoes, pasta, rice, and pretzels.   Eat crackers before you get out of bed in the morning.   Avoid spicy foods.   Ginger may help with nausea. Add  tsp ginger to hot tea or choose ginger tea.   Continue to take your prenatal vitamins as directed by your caregiver.  SAMPLE MEAL PLAN Breakfast    cup oatmeal   1 slice toast   1 tsp heart-healthy margarine   1 tsp jelly   1 scrambled egg  Midmorning Snack   1 cup low-fat yogurt  Lunch   Plain ham sandwich   Carrot or celery sticks   1 small apple   3 graham crackers  Midafternoon Snack   Cheese and crackers  Dinner  4 oz pork tenderloin   1 small baked potato   1 tsp margarine    cup broccoli    cup grapes  Evening Snack  1 cup pudding  Document Released: 02/28/2007 Document Revised: 04/22/2011 Document Reviewed: 08/28/2010 [employer] Patient Information 2012 [employer].Hyperemesis Gravidarum Hyperemesis   gravidarum is a severe form of nausea and vomiting that happens during pregnancy. Hyperemesis is worse than morning sickness. It may cause a woman to have nausea or vomiting all day for many days. It may keep a woman from eating and drinking enough food and liquids. Hyperemesis usually occurs during the first half (the first 20 weeks) of pregnancy. It often goes away once a woman is in her second half of pregnancy. However, sometimes hyperemesis continues through an entire pregnancy.  CAUSES  The cause of this condition is not  completely known but is thought to be due to changes in the body's hormones when pregnant. It could be the high level of the pregnancy hormone or an increase in estrogen in the body.  SYMPTOMS   Severe nausea and vomiting.   Nausea that does not go away.   Vomiting that does not allow you to keep any food down.   Weight loss and body fluid loss (dehydration).   Having no desire to eat or not liking food you have previously enjoyed.  DIAGNOSIS  Your caregiver may ask you about your symptoms. Your caregiver may also order blood tests and urine tests to make sure something else is not causing the problem.  TREATMENT  You may only need medicine to control the problem. If medicines do not control the nausea and vomiting, you will be treated in the hospital to prevent dehydration, acidosis, weight loss, and changes in the electrolytes in your body that may harm the unborn baby (fetus). You may need intravenous (IV) fluids.  HOME CARE INSTRUCTIONS   Take all medicine as directed by your caregiver.   Try eating a couple of dry crackers or toast in the morning before getting out of bed.   Avoid foods and smells that upset your stomach.   Avoid fatty and spicy foods. Eat 5 to 6 small meals a day.   Do not drink when eating meals. Drink between meals.   For snacks, eat high protein foods, such as cheese. Eat or suck on things that have ginger in them. Ginger helps nausea.   Avoid food preparation. The smell of food can spoil your appetite.   Avoid iron pills and iron in your multivitamins until after 3 to 4 months of being pregnant.  SEEK MEDICAL CARE IF:   Your abdominal pain increases since the last time you saw your caregiver.   You have a severe headache.   You develop vision problems.   You feel you are losing weight.  SEEK IMMEDIATE MEDICAL CARE IF:   You are unable to keep fluids down.   You vomit blood.   You have constant nausea and vomiting.   You have a fever.    You have excessive weakness, dizziness, fainting, or extreme thirst.  MAKE SURE YOU:   Understand these instructions.   Will watch your condition.   Will get help right away if you are not doing well or get worse.  Document Released: 05/03/2005 Document Revised: 04/22/2011 Document Reviewed: 08/03/2010 Exit[employer] Patient Information 2012 [employer]. 

## 2011-08-19 NOTE — MAU Note (Signed)
Pt states was seen at Urgent Care, and told she was pregnant.

## 2011-08-29 ENCOUNTER — Inpatient Hospital Stay (HOSPITAL_COMMUNITY)
Admission: AD | Admit: 2011-08-29 | Discharge: 2011-08-29 | Disposition: A | Discharge status: Hospice | Payer: Medicaid Other | Source: Ambulatory Visit | Attending: Obstetrics & Gynecology | Admitting: Obstetrics & Gynecology

## 2011-08-29 ENCOUNTER — Encounter (HOSPITAL_COMMUNITY): Payer: Self-pay | Admitting: *Deleted

## 2011-08-29 DIAGNOSIS — K5900 Constipation, unspecified: Secondary | ICD-10-CM | POA: Insufficient documentation

## 2011-08-29 DIAGNOSIS — R1032 Left lower quadrant pain: Secondary | ICD-10-CM | POA: Insufficient documentation

## 2011-08-29 DIAGNOSIS — O99891 Other specified diseases and conditions complicating pregnancy: Secondary | ICD-10-CM | POA: Insufficient documentation

## 2011-08-29 HISTORY — DX: Benign neoplasm of connective and other soft tissue, unspecified: D21.9

## 2011-08-29 LAB — URINALYSIS, ROUTINE W REFLEX MICROSCOPIC
Glucose, UA: NEGATIVE mg/dL
Hgb urine dipstick: NEGATIVE
Protein, ur: NEGATIVE mg/dL

## 2011-08-29 NOTE — MAU Provider Note (Signed)
History   Pt presents today c/o LLQ pain and constipation. She states her last BM was yesterday. She is currently 7.[redacted]wks pregnant with a documented IUP. She denies vag bleeding, dc, fever, or any other sx at this time.  CSN: [id number]  Arrival date and time: 08/29/11 2222   First Provider Initiated Contact with Patient 08/29/11 2255      Chief Complaint  Patient presents with  . Abdominal Pain   HPI  OB History    Grav Para Term Preterm Abortions TAB SAB Ect Mult Living   4 1 1  2 2    1       Past Medical History  Diagnosis Date  . Asthma   . Hyperemesis gravidarum   . Fibroid     Past Surgical History  Procedure Date  . Dilation and curettage of uterus     Family History  Problem Relation Age of Onset  . Anesthesia problems Neg Hx   . Hypotension Neg Hx   . Malignant hyperthermia Neg Hx   . Pseudochol deficiency Neg Hx   . Asthma Father   . Asthma Sister   . Asthma Daughter   . Asthma Maternal Aunt   . Diabetes Maternal Aunt   . Cancer Maternal Grandmother     History  Substance Use Topics  . Smoking status: Former Games developer  . Smokeless tobacco: Never Used  . Alcohol Use: No     stopped once she found out she was pregnant    Allergies:  Allergies  Allergen Reactions  . Penicillins Hives  . Tylenol (Acetaminophen) Hives    Prescriptions prior to admission  Medication Sig Dispense Refill  . albuterol (PROVENTIL HFA;VENTOLIN HFA) 108 (90 BASE) MCG/ACT inhaler Inhale 2 puffs into the lungs every 6 (six) hours as needed.      . promethazine (PHENERGAN) 25 MG suppository Place 1 suppository (25 mg total) rectally every 6 (six) hours as needed for nausea.  12 each  0  . promethazine (PHENERGAN) 25 MG tablet Take 0.5 tablets (12.5 mg total) by mouth every 6 (six) hours as needed for nausea.  20 tablet  0    Review of Systems  Constitutional: Negative for fever and chills.  Eyes: Negative for blurred vision and double vision.  Respiratory: Negative for  cough, hemoptysis, sputum production, shortness of breath and wheezing.   Cardiovascular: Negative for chest pain and palpitations.  Gastrointestinal: Positive for abdominal pain and constipation. Negative for nausea, vomiting and diarrhea.  Genitourinary: Negative for dysuria, urgency, frequency and hematuria.  Neurological: Negative for dizziness and headaches.  Psychiatric/Behavioral: Negative for depression and suicidal ideas.   Physical Exam   Blood pressure 137/71, pulse 94, temperature 98.3 F (36.8 C), temperature source Oral, resp. rate 20, height 5\' 6"  (1.676 m), weight 142 lb (64.411 kg), last menstrual period 07/06/2011, SpO2 100.00%.  Physical Exam  Nursing note and vitals reviewed. Constitutional: She is oriented to person, place, and time. She appears well-developed and well-nourished. No distress.  HENT:  Head: Normocephalic and atraumatic.  Eyes: EOM are normal. Pupils are equal, round, and reactive to light.  GI: Soft. She exhibits distension. She exhibits no mass. There is no tenderness. There is no rigidity, no rebound, no guarding, no tenderness at McBurney's point and negative Murphy's sign.  Genitourinary: No vaginal discharge found.       No evidence of fecal impaction. Abd is soft, but she does feel "bloated".  Neurological: She is alert and oriented to person, place, and  time.  Skin: Skin is warm and dry. She is not diaphoretic.  Psychiatric: She has a normal mood and affect. Her behavior is normal. Judgment and thought content normal.    MAU Course  Procedures  Results for orders placed during the hospital encounter of 08/29/11 (from the past 24 hour(s))  URINALYSIS, ROUTINE W REFLEX MICROSCOPIC     Status: Abnormal   Collection Time   08/29/11 10:35 PM      Component Value Range   Color, Urine YELLOW  YELLOW    APPearance CLOUDY (*) CLEAR    Specific Gravity, Urine 1.010  1.005 - 1.030    pH 6.5  5.0 - 8.0    Glucose, UA NEGATIVE  NEGATIVE (mg/dL)    Hgb urine dipstick NEGATIVE  NEGATIVE    Bilirubin Urine NEGATIVE  NEGATIVE    Ketones, ur NEGATIVE  NEGATIVE (mg/dL)   Protein, ur NEGATIVE  NEGATIVE (mg/dL)   Urobilinogen, UA 0.2  0.0 - 1.0 (mg/dL)   Nitrite NEGATIVE  NEGATIVE    Leukocytes, UA NEGATIVE  NEGATIVE      Assessment and Plan  Constipation: discussed with pt at length. Will have pt try OTC colace and mirilax. She is to increase her fluid intake and fiber intake. Discussed diet, activity, risks, and precautions.  Clinton Gallant. Oneida Mckamey III, DrHSc, MPAS, PA-C  08/29/2011, 10:58 PM

## 2011-08-29 NOTE — MAU Note (Signed)
Pt sttes she has abd pain localized to the left mid abdomen and the left side of her lower back tht comes and goes for the last 4 hours-denies unusual discharge or bleeding

## 2011-08-29 NOTE — Discharge Instructions (Signed)

## 2011-08-29 NOTE — MAU Note (Signed)
Pt c/o lower abd and low back pain @ 1800 tonight without bleeding.  States she felt vag/rectal pressuer on the car ride over tonight that has subsided.

## 2011-08-30 NOTE — MAU Provider Note (Signed)
Medical Screening exam and patient care preformed by advanced practice provider.  Agree with the above management.  

## 2011-10-20 ENCOUNTER — Inpatient Hospital Stay (HOSPITAL_COMMUNITY)
Admission: AD | Admit: 2011-10-20 | Discharge: 2011-10-21 | Disposition: A | Discharge status: Home / Self Care | Payer: Medicaid Other | Source: Ambulatory Visit | Attending: Emergency Medicine | Admitting: Emergency Medicine

## 2011-10-20 ENCOUNTER — Encounter (HOSPITAL_COMMUNITY): Payer: Self-pay | Admitting: *Deleted

## 2011-10-20 DIAGNOSIS — R42 Dizziness and giddiness: Secondary | ICD-10-CM | POA: Insufficient documentation

## 2011-10-20 DIAGNOSIS — R51 Headache: Secondary | ICD-10-CM | POA: Insufficient documentation

## 2011-10-20 DIAGNOSIS — O210 Mild hyperemesis gravidarum: Secondary | ICD-10-CM | POA: Insufficient documentation

## 2011-10-20 DIAGNOSIS — Z331 Pregnant state, incidental: Secondary | ICD-10-CM

## 2011-10-20 LAB — URINALYSIS, ROUTINE W REFLEX MICROSCOPIC
Glucose, UA: NEGATIVE mg/dL
Ketones, ur: NEGATIVE mg/dL
Leukocytes, UA: NEGATIVE
pH: 7.5 (ref 5.0–8.0)

## 2011-10-20 MED ORDER — SODIUM CHLORIDE 0.9 % IV SOLN
INTRAVENOUS | Status: DC
  Administered 2011-10-20 (×2): via INTRAVENOUS

## 2011-10-20 MED ORDER — MAGNESIUM SULFATE 40 MG/ML IJ SOLN
2.0000 g | Freq: Once | INTRAMUSCULAR | Status: AC
  Administered 2011-10-20: 2 g via INTRAVENOUS
  Filled 2011-10-20: qty 50

## 2011-10-20 MED ORDER — ONDANSETRON HCL 4 MG/2ML IJ SOLN
4.0000 mg | Freq: Once | INTRAMUSCULAR | Status: AC
  Administered 2011-10-20: 4 mg via INTRAVENOUS
  Filled 2011-10-20: qty 2

## 2011-10-20 MED ORDER — PROMETHAZINE HCL 25 MG PO TABS
25.0000 mg | ORAL_TABLET | Freq: Once | ORAL | Status: AC
  Administered 2011-10-21: 25 mg via ORAL
  Filled 2011-10-20: qty 1

## 2011-10-20 MED ORDER — SODIUM CHLORIDE 0.9 % IV BOLUS (SEPSIS): 1000.0000 mL | Freq: Once | INTRAVENOUS | Status: AC

## 2011-10-20 MED ORDER — MAGNESIUM SULFATE 50 % IJ SOLN: 2.0000 g | Freq: Once | INTRAMUSCULAR | Status: DC

## 2011-10-20 NOTE — ED Notes (Signed)
ZOX:[id number]<AV> Expected date:10/20/11<BR> Expected time: 8:51 PM<BR> Means of arrival:Ambulance<BR> Comments:<BR> N,v,headache, 15 weeks preg, transfer [facility]

## 2011-10-20 NOTE — MAU Note (Signed)
Pt complaining of headache and dizziness with nausea and vomiting. Pt states she had headache at 2pm. Pt states she started vomiting after 5pm pt states she was vomitng blood,'mostly blood, chunks of blood the toilet made it look like I was on my period"

## 2011-10-20 NOTE — Progress Notes (Signed)
Valuables remain with pt .

## 2011-10-20 NOTE — ED Notes (Signed)
Pt has been transferred from [facility].  PT was was at work today and and around 2pm she became dizzy, w/ n/v and h/a.  Pt is [redacted] wks pregnant w/[facility]  Pt told [facility] she is feeling the baby move.

## 2011-10-20 NOTE — MAU Note (Signed)
Onset of headache around 2:00 p.m. A little after 5:00 p.m. Vomited x 3 with dizziness [redacted]w[redacted]d

## 2011-10-20 NOTE — MAU Provider Note (Signed)
History     CSN: [id number]  Arrival date & time 10/20/11  1844   None     Chief Complaint  Patient presents with  . Dizziness  . Headache  . Emesis During Pregnancy    HPI Jean Ali is a 26 y.o. female @ [redacted]w[redacted]d gestation who presents to MAU for nausea, vomiting and headache. The nausea started approximately 2 pm while in school, followed by the headache. She went home and the headache continued to get worse. She began feeling dizzy and had difficulty walking. She rates the pain as a 10/10. She has had migraines before but states that this is the worst headache of her life. The headache is located in the frontal area.  The history was provided by the patient.    Past Medical History  Diagnosis Date  . Asthma   . Hyperemesis gravidarum   . Fibroid     Past Surgical History  Procedure Date  . Dilation and curettage of uterus     Family History  Problem Relation Age of Onset  . Anesthesia problems Neg Hx   . Hypotension Neg Hx   . Malignant hyperthermia Neg Hx   . Pseudochol deficiency Neg Hx   . Asthma Father   . Asthma Sister   . Asthma Daughter   . Asthma Maternal Aunt   . Diabetes Maternal Aunt   . Cancer Maternal Grandmother     History  Substance Use Topics  . Smoking status: Former Games developer  . Smokeless tobacco: Never Used  . Alcohol Use: No     stopped once she found out she was pregnant    OB History    Grav Para Term Preterm Abortions TAB SAB Ect Mult Living   4 1 1  2 2    1       Review of Systems  Constitutional: Positive for fever, chills and appetite change. Negative for diaphoresis and fatigue.  HENT: Negative for ear pain, congestion, sore throat, facial swelling, neck pain, neck stiffness, dental problem and sinus pressure.   Eyes: Positive for visual disturbance. Negative for photophobia, pain and discharge.  Respiratory: Negative for cough, chest tightness and wheezing.   Cardiovascular: Negative for chest pain, palpitations and leg  swelling.  Gastrointestinal: Positive for nausea and vomiting. Negative for abdominal pain, diarrhea, constipation and abdominal distention.  Genitourinary: Negative for dysuria, frequency, flank pain, vaginal bleeding, vaginal discharge, difficulty urinating and pelvic pain.  Musculoskeletal: Negative for myalgias, back pain and gait problem.  Skin: Negative for color change and rash.  Neurological: Positive for dizziness, light-headedness and headaches. Negative for syncope, speech difficulty, weakness and numbness.  Psychiatric/Behavioral: Negative for confusion and agitation. The patient is not nervous/anxious.        History of anxiety and depression. Currently no taking medication.    Allergies  Penicillins and Tylenol  Home Medications  No current outpatient prescriptions on file.  BP 117/96  Pulse 82  Temp(Src) 99 F (37.2 C) (Oral)  Resp 16  Ht 5\' 6"  (1.676 m)  Wt 139 lb 3.2 oz (63.141 kg)  BMI 22.47 kg/m2  LMP 07/06/2011 Blood pressure repeated and is 116/69  Physical Exam  Nursing note and vitals reviewed. Constitutional: She is oriented to person, place, and time. She appears well-developed and well-nourished. No distress.  HENT:  Head: Normocephalic.  Eyes: EOM are normal.  Neck: Neck supple.  Cardiovascular: Normal rate.   Pulmonary/Chest: Effort normal.  Abdominal: Soft. There is no tenderness.  Positive FHT's.  Musculoskeletal: Normal range of motion. She exhibits no edema.       Pulses strong and equal bilateral.  Neurological: She is alert and oriented to person, place, and time. She has normal strength and normal reflexes. No cranial nerve deficit or sensory deficit. She displays a negative Romberg sign.       Patient has difficulty standing on one foot due to dizziness.  Skin: Skin is warm and dry.  Psychiatric: She has a normal mood and affect. Her behavior is normal. Judgment and thought content normal.   Assessment: Headache with  nausea   [redacted]w[redacted]d gestation  Plan:  Discussed with Dr. Gaynell Face and will transfer patient to [facility]   Discussed with Dr. Effie Shy at [facility] and he will accept the patient.  ED Course  Procedures

## 2011-10-21 NOTE — ED Provider Notes (Signed)
History     CSN: [id number]  Arrival date & time 10/20/11  1844   First MD Initiated Contact with Patient 10/20/11 2308      Chief Complaint  Patient presents with  . Dizziness  . Headache  . Emesis During Pregnancy    (Consider location/radiation/quality/duration/timing/severity/associated sxs/prior treatment) HPI History provided by patient. Headache with lightheadedness and nausea vomiting started earlier today. Gradual onset.  is G4 P1 currently [redacted] weeks pregnant. Present at [facility] hospital and was sent here for further evaluation of headache and vomiting. No fevers. No neck stiffness. Current pregnancy complicated by hyperemesis gravidarum. Take Zofran at home which usually helps. No diarrhea. No abdominal pain. No weakness or numbness. No trouble with gait or vision. Rashes or sick contacts.   Past Medical History  Diagnosis Date  . Asthma   . Hyperemesis gravidarum   . Fibroid     Past Surgical History  Procedure Date  . Dilation and curettage of uterus     Family History  Problem Relation Age of Onset  . Anesthesia problems Neg Hx   . Hypotension Neg Hx   . Malignant hyperthermia Neg Hx   . Pseudochol deficiency Neg Hx   . Asthma Father   . Asthma Sister   . Asthma Daughter   . Asthma Maternal Aunt   . Diabetes Maternal Aunt   . Cancer Maternal Grandmother     History  Substance Use Topics  . Smoking status: Former Games developer  . Smokeless tobacco: Never Used  . Alcohol Use: No     stopped once she found out she was pregnant    OB History    Grav Para Term Preterm Abortions TAB SAB Ect Mult Living   4 1 1  2 2    1       Review of Systems  Constitutional: Negative for fever and chills.  HENT: Negative for neck pain and neck stiffness.   Eyes: Negative for pain.  Respiratory: Negative for shortness of breath.   Cardiovascular: Negative for chest pain.  Gastrointestinal: Positive for nausea and vomiting. Negative for abdominal pain.  Genitourinary:  Negative for dysuria.  Musculoskeletal: Negative for back pain.  Skin: Negative for rash.  Neurological: Positive for headaches.  All other systems reviewed and are negative.    Allergies  Penicillins and Tylenol  Home Medications   Current Outpatient Rx  Name Route Sig Dispense Refill  . ACETAMINOPHEN 500 MG PO TABS Oral Take 500 mg by mouth every 6 (six) hours as needed. headache    . ALBUTEROL SULFATE HFA 108 (90 BASE) MCG/ACT IN AERS Inhalation Inhale 2 puffs into the lungs every 6 (six) hours as needed.    Marland Kitchen ONDANSETRON HCL 4 MG PO TABS Oral Take 4 mg by mouth every 8 (eight) hours as needed. nausea    . PROMETHAZINE HCL 25 MG RE SUPP Rectal Place 1 suppository (25 mg total) rectally every 6 (six) hours as needed for nausea. 12 each 0  . PROMETHAZINE HCL 25 MG PO TABS Oral Take 0.5 tablets (12.5 mg total) by mouth every 6 (six) hours as needed for nausea. 20 tablet 0    BP 120/75  Pulse 88  Temp(Src) 98.6 F (37 C) (Oral)  Resp 18  Ht 5\' 6"  (1.676 m)  Wt 139 lb 3.2 oz (63.141 kg)  BMI 22.47 kg/m2  SpO2 100%  LMP 07/06/2011  Physical Exam  Constitutional: She is oriented to person, place, and time. She appears well-developed and well-nourished.  HENT:  Head: Normocephalic and atraumatic.  Eyes: Conjunctivae and EOM are normal. Pupils are equal, round, and reactive to light.  Neck: Trachea normal. Neck supple. No thyromegaly present.  Cardiovascular: Normal rate, regular rhythm, S1 normal, S2 normal and normal pulses.     No systolic murmur is present   No diastolic murmur is present  Pulses:      Radial pulses are 2+ on the right side, and 2+ on the left side.  Pulmonary/Chest: Effort normal and breath sounds normal. She has no wheezes. She has no rhonchi. She has no rales. She exhibits no tenderness.  Abdominal: Soft. Normal appearance and bowel sounds are normal. There is no tenderness. There is no CVA tenderness and negative Murphy's sign.       Gravid    Musculoskeletal:       BLE:s Calves nontender, no cords or erythema, negative Homans sign  Neurological: She is alert and oriented to person, place, and time. She has normal strength and normal reflexes. No cranial nerve deficit or sensory deficit. GCS eye subscore is 4. GCS verbal subscore is 5. GCS motor subscore is 6.  Skin: Skin is warm and dry. No rash noted. She is not diaphoretic.  Psychiatric: Her speech is normal.       Cooperative and appropriate    ED Course  Procedures (including critical care time)  Labs Reviewed  URINALYSIS, ROUTINE W REFLEX MICROSCOPIC - Abnormal; Notable for the following:    APPearance HAZY (*)    All other components within normal limits   No results found.   1. Headache   2. Pregnancy as incidental finding     IV fluids. Zofran. Tylenol. IV magnesium  He checked at 1 AM is feeling significantly better and requesting to be discharged home. No change normal neuro exam. No indication for emergent CT scan or lumbar puncture.  MDM   Hyperemesis gravidarum with headache and clinical hydration. Improved with fluids and medications as above.  Nursing notes reviewed.  Records reviewed.  Vital signs reviewed.  Plan discharge home. School note provided. Patient has Zofran and agrees to take as needed and followup with her physician        Sunnie Nielsen, MD 10/21/11 [phone]

## 2011-10-21 NOTE — Discharge Instructions (Signed)
Hyperemesis Gravidarum  Hyperemesis gravidarum is a severe form of nausea and vomiting that happens during pregnancy. Hyperemesis is worse than morning sickness. It may cause a woman to have nausea or vomiting all day for many days. It may keep a woman from eating and drinking enough food and liquids. Hyperemesis usually occurs during the first half (the first 20 weeks) of pregnancy. It often goes away once a woman is in her second half of pregnancy. However, sometimes hyperemesis continues through an entire pregnancy.  CAUSES  The cause of this condition is not completely known but is thought to be due to changes in the body's hormones when pregnant. It could be the high level of the pregnancy hormone or an increase in estrogen in the body.  SYMPTOMS  Severe nausea and vomiting.  Nausea that does not go away.  Vomiting that does not allow you to keep any food down.  Weight loss and body fluid loss (dehydration).  Having no desire to eat or not liking food you have previously enjoyed.  DIAGNOSIS  Your caregiver may ask you about your symptoms. Your caregiver may also order blood tests and urine tests to make sure something else is not causing the problem.  TREATMENT  You may only need medicine to control the problem. If medicines do not control the nausea and vomiting, you will be treated in the hospital to prevent dehydration, acidosis, weight loss, and changes in the electrolytes in your body that may harm the unborn baby (fetus). You may need intravenous (IV) fluids.  HOME CARE INSTRUCTIONS  Take all medicine as directed by your caregiver.  Try eating a couple of dry crackers or toast in the morning before getting out of bed.  Avoid foods and smells that upset your stomach.  Avoid fatty and spicy foods. Eat 5 to 6 small meals a day.  Do not drink when eating meals. Drink between meals.  For snacks, eat high protein foods, such as cheese. Eat or suck on things that have ginger in them. Ginger  helps nausea.  Avoid food preparation. The smell of food can spoil your appetite.  Avoid iron pills and iron in your multivitamins until after 3 to 4 months of being pregnant.  SEEK MEDICAL CARE IF:  Your abdominal pain increases since the last time you saw your caregiver.  You have a severe headache.  You develop vision problems.  You feel you are losing weight.  SEEK IMMEDIATE MEDICAL CARE IF:  You are unable to keep fluids down.  You vomit blood.  You have constant nausea and vomiting.  You have a fever.  You have excessive weakness, dizziness, fainting, or extreme thirst.  MAKE SURE YOU:  Understand these instructions.  Will watch your condition.  Will get help right away if you are not doing well or get worse.

## 2011-10-25 LAB — OB RESULTS CONSOLE HIV ANTIBODY (ROUTINE TESTING): HIV: NONREACTIVE

## 2011-12-22 ENCOUNTER — Inpatient Hospital Stay (HOSPITAL_COMMUNITY)
Admission: AD | Admit: 2011-12-22 | Discharge: 2011-12-22 | Disposition: A | Discharge status: Home / Self Care | Payer: Medicaid Other | Source: Ambulatory Visit | Attending: Obstetrics | Admitting: Obstetrics

## 2011-12-22 ENCOUNTER — Encounter (HOSPITAL_COMMUNITY): Payer: Self-pay | Admitting: *Deleted

## 2011-12-22 ENCOUNTER — Inpatient Hospital Stay (HOSPITAL_COMMUNITY): Payer: Medicaid Other

## 2011-12-22 DIAGNOSIS — IMO0002 Reserved for concepts with insufficient information to code with codable children: Secondary | ICD-10-CM | POA: Insufficient documentation

## 2011-12-22 DIAGNOSIS — S301XXA Contusion of abdominal wall, initial encounter: Secondary | ICD-10-CM | POA: Insufficient documentation

## 2011-12-22 DIAGNOSIS — O99891 Other specified diseases and conditions complicating pregnancy: Secondary | ICD-10-CM | POA: Insufficient documentation

## 2011-12-22 DIAGNOSIS — R109 Unspecified abdominal pain: Secondary | ICD-10-CM | POA: Insufficient documentation

## 2011-12-22 DIAGNOSIS — O219 Vomiting of pregnancy, unspecified: Secondary | ICD-10-CM

## 2011-12-22 MED ORDER — OXYCODONE-ACETAMINOPHEN 5-325 MG PO TABS
1.0000 | ORAL_TABLET | Freq: Once | ORAL | Status: AC
  Administered 2011-12-22: 1 via ORAL
  Filled 2011-12-22: qty 1
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
## 2011-12-22 NOTE — MAU Note (Signed)
Pt returned from US, monitors applied

## 2011-12-22 NOTE — MAU Note (Signed)
Warm pack to right side

## 2011-12-22 NOTE — MAU Note (Signed)
Dr. Gaynell Face notified of pt, Orders rec'd.
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
## 2012-01-01 ENCOUNTER — Inpatient Hospital Stay (HOSPITAL_COMMUNITY)
Admission: AD | Admit: 2012-01-01 | Discharge: 2012-01-01 | Disposition: A | Discharge status: Home / Self Care | Payer: Medicaid Other | Source: Ambulatory Visit | Attending: Obstetrics | Admitting: Obstetrics

## 2012-01-01 ENCOUNTER — Encounter (HOSPITAL_COMMUNITY): Payer: Self-pay | Admitting: *Deleted

## 2012-01-01 DIAGNOSIS — O219 Vomiting of pregnancy, unspecified: Secondary | ICD-10-CM

## 2012-01-01 DIAGNOSIS — R109 Unspecified abdominal pain: Secondary | ICD-10-CM | POA: Insufficient documentation

## 2012-01-01 DIAGNOSIS — O212 Late vomiting of pregnancy: Secondary | ICD-10-CM | POA: Insufficient documentation

## 2012-01-01 LAB — URINALYSIS, ROUTINE W REFLEX MICROSCOPIC
Bilirubin Urine: NEGATIVE
Nitrite: NEGATIVE
Specific Gravity, Urine: 1.025 (ref 1.005–1.030)
pH: 6 (ref 5.0–8.0)

## 2012-01-01 LAB — URINE MICROSCOPIC-ADD ON

## 2012-01-01 MED ORDER — PROMETHAZINE HCL 25 MG/ML IJ SOLN
25.0000 mg | Freq: Once | INTRAVENOUS | Status: AC
  Administered 2012-01-01: 25 mg via INTRAVENOUS
  Filled 2012-01-01: qty 1

## 2012-01-01 MED ORDER — ONDANSETRON 8 MG PO TBDP: 8.0000 mg | ORAL_TABLET | Freq: Three times a day (TID) | ORAL | Status: AC | PRN

## 2012-01-01 MED ORDER — PROMETHAZINE HCL 25 MG PO TABS: 25.0000 mg | ORAL_TABLET | Freq: Four times a day (QID) | ORAL | Status: DC | PRN

## 2012-01-01 NOTE — MAU Note (Signed)
Went to sleep at 2300 and awoke at 0100 with pain in upper stomach. Vomited and saw blood in it but have been throwing up since Weds night. Having pain in upper stomach and lower stomach.

## 2012-01-01 NOTE — MAU Note (Signed)
OK to d/c efm per Lilyan Punt NP .

## 2012-01-01 NOTE — MAU Provider Note (Signed)
History     CSN: [id number]  Arrival date and time: 01/01/12 4098   First Provider Initiated Contact with Patient 01/01/12 0430      Chief Complaint  Patient presents with  . Abdominal Pain  . Emesis During Pregnancy   HPI Jean Ali 26 y.o. [redacted]w[redacted]d  Comes to MAU with repetitive vomiting since Wednesday evening.  Is out of Phenergan and Zofran tablets.  Did not take any other medication for vomiting.  Is having upper and lower abdominal pain.  Is having some blood in vomitus.  Saw Dr. Gaynell Face in the office on Wednesday and had had several days with no vomiting prior to appointment.  Has been having vomiting since her appointment on Wednesday.  OB History    Grav Para Term Preterm Abortions TAB SAB Ect Mult Living   4 1 1  2 2    1       Past Medical History  Diagnosis Date  . Asthma   . Hyperemesis gravidarum   . Fibroid     Past Surgical History  Procedure Date  . Dilation and curettage of uterus     Family History  Problem Relation Age of Onset  . Anesthesia problems Neg Hx   . Hypotension Neg Hx   . Malignant hyperthermia Neg Hx   . Pseudochol deficiency Neg Hx   . Asthma Father   . Asthma Sister   . Asthma Daughter   . Asthma Maternal Aunt   . Diabetes Maternal Aunt   . Cancer Maternal Grandmother     History  Substance Use Topics  . Smoking status: Former Games developer  . Smokeless tobacco: Never Used  . Alcohol Use: No     stopped once she found out she was pregnant    Allergies:  Allergies  Allergen Reactions  . Penicillins Hives  . Tylenol (Acetaminophen) Hives    Tolerates Tylox, questionable APAP allergy from youth    Prescriptions prior to admission  Medication Sig Dispense Refill  . oxyCODONE-acetaminophen (TYLOX) 5-500 MG per capsule Take 1 capsule by mouth every 4 (four) hours as needed.      Marland Kitchen acetaminophen (TYLENOL) 500 MG tablet Take 500 mg by mouth every 6 (six) hours as needed. headache      . albuterol (PROVENTIL HFA;VENTOLIN  HFA) 108 (90 BASE) MCG/ACT inhaler Inhale 2 puffs into the lungs every 6 (six) hours as needed.      . ondansetron (ZOFRAN) 4 MG tablet Take 4 mg by mouth every 8 (eight) hours as needed. nausea      . promethazine (PHENERGAN) 25 MG suppository Place 1 suppository (25 mg total) rectally every 6 (six) hours as needed for nausea.  12 each  0  . promethazine (PHENERGAN) 25 MG tablet Take 0.5 tablets (12.5 mg total) by mouth every 6 (six) hours as needed for nausea.  20 tablet  0    Review of Systems  Constitutional: Negative for fever.  Gastrointestinal: Positive for nausea, vomiting and abdominal pain. Negative for diarrhea and constipation.  Genitourinary:       No vaginal discharge. No vaginal bleeding. No dysuria.   Physical Exam   Blood pressure 123/75, pulse 96, temperature 98 F (36.7 C), resp. rate 20, height 5\' 6"  (1.676 m), weight 153 lb (69.4 kg), last menstrual period 07/06/2011.  Physical Exam  Nursing note and vitals reviewed. Constitutional: She is oriented to person, place, and time. She appears well-developed and well-nourished.  HENT:  Head: Normocephalic.  Eyes: EOM are  normal.  Neck: Neck supple.  GI: Soft. There is tenderness. There is no rebound and no guarding.       On monitor.  No contractions.  FHT 145 baseline.  Moderate variability.  Musculoskeletal: Normal range of motion.  Neurological: She is alert and oriented to person, place, and time.  Skin: Skin is warm and dry.  Psychiatric: She has a normal mood and affect.    MAU Course  Procedures Results for orders placed during the hospital encounter of 01/01/12 (from the past 24 hour(s))  URINALYSIS, ROUTINE W REFLEX MICROSCOPIC     Status: Abnormal   Collection Time   01/01/12  4:00 AM      Component Value Range   Color, Urine YELLOW  YELLOW   APPearance CLEAR  CLEAR   Specific Gravity, Urine 1.025  1.005 - 1.030   pH 6.0  5.0 - 8.0   Glucose, UA NEGATIVE  NEGATIVE mg/dL   Hgb urine dipstick  NEGATIVE  NEGATIVE   Bilirubin Urine NEGATIVE  NEGATIVE   Ketones, ur NEGATIVE  NEGATIVE mg/dL   Protein, ur NEGATIVE  NEGATIVE mg/dL   Urobilinogen, UA 0.2  0.0 - 1.0 mg/dL   Nitrite NEGATIVE  NEGATIVE   Leukocytes, UA TRACE (*) NEGATIVE  URINE MICROSCOPIC-ADD ON     Status: Normal   Collection Time   01/01/12  4:00 AM      Component Value Range   Squamous Epithelial / LPF RARE  RARE   WBC, UA 0-2  <3 WBC/hpf   RBC / HPF 0-2  <3 RBC/hpf   Urine-Other MUCOUS PRESENT     MDM Will give Phenergan 25 mg in 1000cc LR at 500 cc per hour for vomiting.  Assessment and Plan  vomting in pregnancy  Plan Phenergan 35 mg in 1000cc LR at 500 cc /hour Will prescribe Phenergan 25 mg Po and Zofran 8 mg ODT for client to use.  BRAT diet   Navin Dogan 01/01/2012, 4:40 AM

## 2012-01-01 NOTE — Progress Notes (Signed)
Written and verbal d/c instructions given and understanding voiced. 

## 2012-01-01 NOTE — MAU Note (Signed)
Pt vomited.   

## 2012-01-01 NOTE — Progress Notes (Signed)
Lilyan Punt NP notified of pt's admission and status. Will see pt

## 2012-02-01 ENCOUNTER — Other Ambulatory Visit (HOSPITAL_COMMUNITY): Payer: Self-pay | Admitting: Obstetrics

## 2012-02-01 DIAGNOSIS — O4440 Low lying placenta NOS or without hemorrhage, unspecified trimester: Secondary | ICD-10-CM

## 2012-02-03 ENCOUNTER — Ambulatory Visit (HOSPITAL_COMMUNITY)
Admission: RE | Admit: 2012-02-03 | Discharge: 2012-02-03 | Disposition: A | Discharge status: Home / Self Care | Payer: Medicaid Other | Source: Ambulatory Visit | Attending: Obstetrics | Admitting: Obstetrics

## 2012-02-03 DIAGNOSIS — O4440 Low lying placenta NOS or without hemorrhage, unspecified trimester: Secondary | ICD-10-CM

## 2012-02-03 DIAGNOSIS — O4400 Placenta previa specified as without hemorrhage, unspecified trimester: Secondary | ICD-10-CM | POA: Insufficient documentation

## 2012-02-03 DIAGNOSIS — Z3689 Encounter for other specified antenatal screening: Secondary | ICD-10-CM | POA: Insufficient documentation

## 2012-02-28 ENCOUNTER — Inpatient Hospital Stay (HOSPITAL_COMMUNITY)
Admission: AD | Admit: 2012-02-28 | Discharge: 2012-02-28 | Disposition: A | Discharge status: Home / Self Care | Payer: Medicaid Other | Source: Ambulatory Visit | Attending: Obstetrics | Admitting: Obstetrics

## 2012-02-28 ENCOUNTER — Encounter (HOSPITAL_COMMUNITY): Payer: Self-pay | Admitting: *Deleted

## 2012-02-28 DIAGNOSIS — A084 Viral intestinal infection, unspecified: Secondary | ICD-10-CM

## 2012-02-28 DIAGNOSIS — A088 Other specified intestinal infections: Secondary | ICD-10-CM

## 2012-02-28 DIAGNOSIS — O212 Late vomiting of pregnancy: Secondary | ICD-10-CM | POA: Insufficient documentation

## 2012-02-28 DIAGNOSIS — O4700 False labor before 37 completed weeks of gestation, unspecified trimester: Secondary | ICD-10-CM | POA: Insufficient documentation

## 2012-02-28 LAB — URINALYSIS, ROUTINE W REFLEX MICROSCOPIC
Bilirubin Urine: NEGATIVE
Hgb urine dipstick: NEGATIVE
Specific Gravity, Urine: >1.03 — ABNORMAL HIGH (ref 1.005–1.030)
Urobilinogen, UA: 0.2 mg/dL (ref 0.0–1.0)
pH: 6 (ref 5.0–8.0)

## 2012-02-28 MED ORDER — PROMETHAZINE HCL 25 MG PO TABS: 25.0000 mg | ORAL_TABLET | Freq: Four times a day (QID) | ORAL | Status: DC | PRN

## 2012-02-28 NOTE — MAU Provider Note (Signed)
Jean Ali is a 26 y.o. female presenting for eval of N/V/D and ctx. Denies bldg. Reports ? Leaking yesterday but none today. States GI symptoms began this morning. No fever. Reports +FM. History OB History    Grav Para Term Preterm Abortions TAB SAB Ect Mult Living   4 1 1  2 2    1      Past Medical History  Diagnosis Date  . Asthma   . Hyperemesis gravidarum   . Fibroid    Past Surgical History  Procedure Date  . Dilation and curettage of uterus    Family History: family history includes Asthma in her daughter, father, maternal aunt, and sister; Cancer in her maternal grandmother; and Diabetes in her maternal aunt.  There is no history of Anesthesia problems, and Hypotension, and Malignant hyperthermia, and Pseudochol deficiency, . Social History:  reports that she has quit smoking. She has never used smokeless tobacco. She reports that she does not drink alcohol or use illicit drugs.   ROS  Dilation: Closed Effacement (%): Thick Exam by:: K.Dawnelle Warman,CNM Blood pressure 108/58, pulse 100, temperature 98.1 F (36.7 C), temperature source Oral, resp. rate 20, height 5\' 6"  (1.676 m), weight 72.122 kg (159 lb), last menstrual period 07/06/2011, SpO2 100.00%. Maternal Exam:  Uterine Assessment: Initially with ctx q 3-6 mins; now have spaced out and having mostly irritability  Introitus: Ferning test: negative.   Cervix: Cervix evaluated by sterile speculum exam.   cx closed/thick/post; only sm white vag d/c seen at cx  Fetal Exam Fetal Monitor Review: Baseline rate: 135.  Variability: moderate (6-25 bpm).   Pattern: accelerations present and no decelerations.    Fetal State Assessment: Category I - tracings are normal.     Physical Exam  Constitutional: She is oriented to person, place, and time. She appears well-developed.  HENT:  Head: Normocephalic.  Cardiovascular: Normal rate.   Respiratory: Effort normal.  Genitourinary: Vagina normal.       Clitoral piercing  noted  Musculoskeletal: Normal range of motion.  Neurological: She is alert and oriented to person, place, and time.  Skin: Skin is warm and dry.  Psychiatric: She has a normal mood and affect. Her behavior is normal. Thought content normal.    Urinalysis    Component Value Date/Time   COLORURINE YELLOW 02/28/2012 2035   APPEARANCEUR CLEAR 02/28/2012 2035   LABSPEC >1.030* 02/28/2012 2035   PHURINE 6.0 02/28/2012 2035   GLUCOSEU NEGATIVE 02/28/2012 2035   HGBUR NEGATIVE 02/28/2012 2035   BILIRUBINUR NEGATIVE 02/28/2012 2035   KETONESUR NEGATIVE 02/28/2012 2035   PROTEINUR NEGATIVE 02/28/2012 2035   UROBILINOGEN 0.2 02/28/2012 2035   NITRITE NEGATIVE 02/28/2012 2035   LEUKOCYTESUR NEGATIVE 02/28/2012 2035     Prenatal labs: ABO, Rh: --/--/O POS (04/04 1151) Antibody:   Rubella:   RPR:    HBsAg:    HIV:    GBS:     Assessment/Plan: IUP at 33.6wks Probably viral gastroenteritis Braxton hicks contractions  D/C home with inst to stay hydrated and eat blandly Keep next scheduled visit unless symptoms become worse   Jean Ali 02/28/2012, 10:06 PM

## 2012-02-28 NOTE — MAU Note (Signed)
Pt states contractions started at 1845 and are 10 minutes apart. Diarrhea and vomiting have been going on since 1900.

## 2012-03-09 LAB — OB RESULTS CONSOLE GBS: GBS: NEGATIVE

## 2012-03-13 ENCOUNTER — Encounter: Payer: Medicaid Other | Admitting: Internal Medicine

## 2012-04-07 ENCOUNTER — Telehealth (HOSPITAL_COMMUNITY): Payer: Self-pay | Admitting: *Deleted

## 2012-04-07 ENCOUNTER — Encounter (HOSPITAL_COMMUNITY): Payer: Self-pay | Admitting: *Deleted

## 2012-04-07 NOTE — Telephone Encounter (Signed)
Preadmission screen  

## 2012-04-08 ENCOUNTER — Encounter (HOSPITAL_COMMUNITY): Payer: Self-pay | Admitting: Anesthesiology

## 2012-04-08 ENCOUNTER — Encounter (HOSPITAL_COMMUNITY): Payer: Self-pay

## 2012-04-08 ENCOUNTER — Inpatient Hospital Stay (HOSPITAL_COMMUNITY)
Admission: RE | Admit: 2012-04-08 | Discharge: 2012-04-10 | DRG: 775 | Disposition: A | Discharge status: Home / Self Care | Payer: Medicaid Other | Source: Ambulatory Visit | Attending: Obstetrics | Admitting: Obstetrics

## 2012-04-08 ENCOUNTER — Inpatient Hospital Stay (HOSPITAL_COMMUNITY): Payer: Medicaid Other | Admitting: Anesthesiology

## 2012-04-08 DIAGNOSIS — J45909 Unspecified asthma, uncomplicated: Secondary | ICD-10-CM

## 2012-04-08 LAB — CBC
HCT: 33.8 % — ABNORMAL LOW (ref 36.0–46.0)
Hemoglobin: 11.3 g/dL — ABNORMAL LOW (ref 12.0–15.0)
MCH: 29.7 pg (ref 26.0–34.0)
MCHC: 33.4 g/dL (ref 30.0–36.0)
MCV: 88.9 fL (ref 78.0–100.0)

## 2012-04-08 MED ORDER — PHENYLEPHRINE 40 MCG/ML (10ML) SYRINGE FOR IV PUSH (FOR BLOOD PRESSURE SUPPORT)
80.0000 ug | PREFILLED_SYRINGE | INTRAVENOUS | Status: DC | PRN
  Filled 2012-04-08: qty 5

## 2012-04-08 MED ORDER — WITCH HAZEL-GLYCERIN EX PADS: 1.0000 "application " | MEDICATED_PAD | CUTANEOUS | Status: DC | PRN

## 2012-04-08 MED ORDER — LIDOCAINE HCL (PF) 1 % IJ SOLN: 30.0000 mL | INTRAMUSCULAR | Status: DC | PRN

## 2012-04-08 MED ORDER — FENTANYL 2.5 MCG/ML BUPIVACAINE 1/10 % EPIDURAL INFUSION (WH - ANES)
14.0000 mL/h | INTRAMUSCULAR | Status: DC
  Administered 2012-04-08: 14 mL/h via EPIDURAL
  Filled 2012-04-08: qty 125

## 2012-04-08 MED ORDER — OXYTOCIN 40 UNITS IN LACTATED RINGERS INFUSION - SIMPLE MED
1.0000 m[IU]/min | INTRAVENOUS | Status: DC
  Administered 2012-04-08: 2 m[IU]/min via INTRAVENOUS
  Filled 2012-04-08: qty 1000

## 2012-04-08 MED ORDER — LIDOCAINE HCL (PF) 1 % IJ SOLN
INTRAMUSCULAR | Status: DC | PRN
  Administered 2012-04-08 (×2): 5 mL

## 2012-04-08 MED ORDER — LACTATED RINGERS IV SOLN: 500.0000 mL | INTRAVENOUS | Status: DC | PRN

## 2012-04-08 MED ORDER — EPHEDRINE 5 MG/ML INJ: 10.0000 mg | INTRAVENOUS | Status: DC | PRN

## 2012-04-08 MED ORDER — CITRIC ACID-SODIUM CITRATE 334-500 MG/5ML PO SOLN: 30.0000 mL | ORAL | Status: DC | PRN

## 2012-04-08 MED ORDER — INFLUENZA VIRUS VACC SPLIT PF IM SUSP
0.5000 mL | INTRAMUSCULAR | Status: AC
  Administered 2012-04-09: 0.5 mL via INTRAMUSCULAR
  Filled 2012-04-08: qty 0.5

## 2012-04-08 MED ORDER — DIPHENHYDRAMINE HCL 25 MG PO CAPS: 25.0000 mg | ORAL_CAPSULE | Freq: Four times a day (QID) | ORAL | Status: DC | PRN

## 2012-04-08 MED ORDER — PHENYLEPHRINE 40 MCG/ML (10ML) SYRINGE FOR IV PUSH (FOR BLOOD PRESSURE SUPPORT): 80.0000 ug | PREFILLED_SYRINGE | INTRAVENOUS | Status: DC | PRN

## 2012-04-08 MED ORDER — LACTATED RINGERS IV SOLN: INTRAVENOUS | Status: DC

## 2012-04-08 MED ORDER — TERBUTALINE SULFATE 1 MG/ML IJ SOLN: 0.2500 mg | Freq: Once | INTRAMUSCULAR | Status: DC | PRN

## 2012-04-08 MED ORDER — FERROUS SULFATE 325 (65 FE) MG PO TABS
325.0000 mg | ORAL_TABLET | Freq: Two times a day (BID) | ORAL | Status: DC
  Administered 2012-04-09 – 2012-04-10 (×2): 325 mg via ORAL
  Filled 2012-04-08 (×2): qty 1

## 2012-04-08 MED ORDER — OXYCODONE-ACETAMINOPHEN 5-325 MG PO TABS
1.0000 | ORAL_TABLET | ORAL | Status: DC | PRN
  Administered 2012-04-09: 1 via ORAL
  Filled 2012-04-08: qty 1

## 2012-04-08 MED ORDER — BUTORPHANOL TARTRATE 1 MG/ML IJ SOLN
1.0000 mg | INTRAMUSCULAR | Status: DC | PRN
  Administered 2012-04-08: 1 mg via INTRAVENOUS
  Filled 2012-04-08: qty 1

## 2012-04-08 MED ORDER — ONDANSETRON HCL 4 MG/2ML IJ SOLN: 4.0000 mg | Freq: Four times a day (QID) | INTRAMUSCULAR | Status: DC | PRN

## 2012-04-08 MED ORDER — LANOLIN HYDROUS EX OINT: TOPICAL_OINTMENT | CUTANEOUS | Status: DC | PRN

## 2012-04-08 MED ORDER — PNEUMOCOCCAL VAC POLYVALENT 25 MCG/0.5ML IJ INJ
0.5000 mL | INJECTION | INTRAMUSCULAR | Status: AC
  Administered 2012-04-10: 0.5 mL via INTRAMUSCULAR
  Filled 2012-04-08: qty 0.5

## 2012-04-08 MED ORDER — EPHEDRINE 5 MG/ML INJ
10.0000 mg | INTRAVENOUS | Status: DC | PRN
  Filled 2012-04-08: qty 4

## 2012-04-08 MED ORDER — LACTATED RINGERS IV SOLN: 500.0000 mL | Freq: Once | INTRAVENOUS | Status: DC

## 2012-04-08 MED ORDER — IBUPROFEN 600 MG PO TABS
600.0000 mg | ORAL_TABLET | Freq: Four times a day (QID) | ORAL | Status: DC | PRN
  Filled 2012-04-08 (×6): qty 1

## 2012-04-08 MED ORDER — SIMETHICONE 80 MG PO CHEW: 80.0000 mg | CHEWABLE_TABLET | ORAL | Status: DC | PRN

## 2012-04-08 MED ORDER — FLEET ENEMA 7-19 GM/118ML RE ENEM: 1.0000 | ENEMA | RECTAL | Status: DC | PRN

## 2012-04-08 MED ORDER — DIPHENHYDRAMINE HCL 50 MG/ML IJ SOLN: 12.5000 mg | INTRAMUSCULAR | Status: DC | PRN

## 2012-04-08 MED ORDER — BENZOCAINE-MENTHOL 20-0.5 % EX AERO: 1.0000 "application " | INHALATION_SPRAY | CUTANEOUS | Status: DC | PRN

## 2012-04-08 MED ORDER — TETANUS-DIPHTH-ACELL PERTUSSIS 5-2.5-18.5 LF-MCG/0.5 IM SUSP
0.5000 mL | Freq: Once | INTRAMUSCULAR | Status: AC
  Administered 2012-04-10: 0.5 mL via INTRAMUSCULAR

## 2012-04-08 MED ORDER — ONDANSETRON HCL 4 MG PO TABS
4.0000 mg | ORAL_TABLET | ORAL | Status: DC | PRN
  Administered 2012-04-08: 4 mg via ORAL
  Filled 2012-04-08: qty 1

## 2012-04-08 MED ORDER — PRENATAL MULTIVITAMIN CH
1.0000 | ORAL_TABLET | Freq: Every day | ORAL | Status: DC
  Administered 2012-04-09 – 2012-04-10 (×2): 1 via ORAL
  Filled 2012-04-08 (×2): qty 1

## 2012-04-08 MED ORDER — ZOLPIDEM TARTRATE 5 MG PO TABS: 5.0000 mg | ORAL_TABLET | Freq: Every evening | ORAL | Status: DC | PRN

## 2012-04-08 MED ORDER — DIBUCAINE 1 % RE OINT: 1.0000 "application " | TOPICAL_OINTMENT | RECTAL | Status: DC | PRN

## 2012-04-08 MED ORDER — IBUPROFEN 600 MG PO TABS
600.0000 mg | ORAL_TABLET | Freq: Four times a day (QID) | ORAL | Status: DC
  Administered 2012-04-08 – 2012-04-10 (×7): 600 mg via ORAL
  Filled 2012-04-08: qty 1

## 2012-04-08 MED ORDER — SENNOSIDES-DOCUSATE SODIUM 8.6-50 MG PO TABS
2.0000 | ORAL_TABLET | Freq: Every day | ORAL | Status: DC
  Administered 2012-04-08 – 2012-04-09 (×2): 2 via ORAL

## 2012-04-08 MED ORDER — OXYTOCIN 40 UNITS IN LACTATED RINGERS INFUSION - SIMPLE MED: 62.5000 mL/h | INTRAVENOUS | Status: DC

## 2012-04-08 MED ORDER — ONDANSETRON HCL 4 MG/2ML IJ SOLN: 4.0000 mg | INTRAMUSCULAR | Status: DC | PRN

## 2012-04-08 MED ORDER — OXYTOCIN BOLUS FROM INFUSION: 500.0000 mL | INTRAVENOUS | Status: DC

## 2012-04-08 NOTE — Anesthesia Procedure Notes (Signed)
Epidural Patient location during procedure: OB Start time: 04/08/2012 2:13 PM  Staffing Anesthesiologist: Brayton Caves R Performed by: anesthesiologist   Preanesthetic Checklist Completed: patient identified, site marked, surgical consent, pre-op evaluation, timeout performed, IV checked, risks and benefits discussed and monitors and equipment checked  Epidural Patient position: sitting Prep: site prepped and draped and DuraPrep Patient monitoring: continuous pulse ox and blood pressure Approach: midline Injection technique: LOR air and LOR saline  Needle:  Needle type: Tuohy  Needle gauge: 17 G Needle length: 9 cm and 9 Needle insertion depth: 5 cm cm Catheter type: closed end flexible Catheter size: 19 Gauge Catheter at skin depth: 10 cm Test dose: negative  Assessment Events: blood not aspirated, injection not painful, no injection resistance, negative IV test and no paresthesia  Additional Notes Patient identified.  Risk benefits discussed including failed block, incomplete pain control, headache, nerve damage, paralysis, blood pressure changes, nausea, vomiting, reactions to medication both toxic or allergic, and postpartum back pain.  Patient expressed understanding and wished to proceed.  All questions were answered.  Sterile technique used throughout procedure and epidural site dressed with sterile barrier dressing. No paresthesia or other complications noted.The patient did not experience any signs of intravascular injection such as tinnitus or metallic taste in mouth nor signs of intrathecal spread such as rapid motor block. Please see nursing notes for vital signs.

## 2012-04-08 NOTE — H&P (Signed)
This is Dr. Francoise Ceo dictating the history and physical on  Jean Ali a 26 year old gravida 4 para 1021 at 37 weeks and 4 days EDC 04/11/2012 negative GBS desires induction cervix 1 cm 80% vertex -2 station amniotomy performed the fluids clear she is having irregular contractions Past medical history negative Past surgical history negative Social history negative System review noncontributory Physical exam well-developed female in no distress HEENT negative Lungs clear to P&A Heart regular rhythm no murmurs no gallops Breasts engorged Abdomen term Pelvic as described above Extremities negative

## 2012-04-08 NOTE — Anesthesia Preprocedure Evaluation (Addendum)
Anesthesia Evaluation  Patient identified by MRN, date of birth, ID band Patient awake    Reviewed: Allergy & Precautions, H&P , Patient's Chart, lab work & pertinent test results  Airway Mallampati: II TM Distance: >3 FB Neck ROM: full    Dental No notable dental hx.    Pulmonary neg pulmonary ROS, asthma ,  breath sounds clear to auscultation  Pulmonary exam normal       Cardiovascular negative cardio ROS  Rhythm:regular Rate:Normal     Neuro/Psych negative neurological ROS  negative psych ROS   GI/Hepatic negative GI ROS, Neg liver ROS,   Endo/Other  negative endocrine ROS  Renal/GU negative Renal ROS     Musculoskeletal   Abdominal   Peds  Hematology negative hematology ROS (+)   Anesthesia Other Findings Asthma     Hyperemesis gravidarum        Fibroid     Depression    Reproductive/Obstetrics (+) Pregnancy                          Anesthesia Physical Anesthesia Plan  ASA: II  Anesthesia Plan: Epidural   Post-op Pain Management:    Induction:   Airway Management Planned:   Additional Equipment:   Intra-op Plan:   Post-operative Plan:   Informed Consent: I have reviewed the patients History and Physical, chart, labs and discussed the procedure including the risks, benefits and alternatives for the proposed anesthesia with the patient or authorized representative who has indicated his/her understanding and acceptance.     Plan Discussed with:   Anesthesia Plan Comments:         Anesthesia Quick Evaluation

## 2012-04-09 LAB — CBC
Hemoglobin: 11.1 g/dL — ABNORMAL LOW (ref 12.0–15.0)
MCHC: 33.8 g/dL (ref 30.0–36.0)
Platelets: 182 10*3/uL (ref 150–400)
RDW: 13.3 % (ref 11.5–15.5)

## 2012-04-09 NOTE — Anesthesia Postprocedure Evaluation (Signed)
  Anesthesia Post-op Note  Patient: Jean Ali  Procedure(s) Performed: * No procedures listed *  Patient Location: PACU and Mother/Baby  Anesthesia Type:Epidural  Level of Consciousness: awake, alert  and oriented  Airway and Oxygen Therapy: Patient Spontanous Breathing  Post-op Pain: mild  Post-op Assessment: Patient's Cardiovascular Status Stable, Respiratory Function Stable, Patent Airway, No signs of Nausea or vomiting and Pain level controlled  Post-op Vital Signs: stable  Complications: No apparent anesthesia complications

## 2012-04-09 NOTE — Progress Notes (Signed)
Patient ID: Jean Ali, female   DOB: 11/05/1985, 26 y.o.   MRN: [id number] Postpartum day one Vital signs normal Fundus firm Lochia moderate Legs negative doing well

## 2012-04-10 ENCOUNTER — Ambulatory Visit (INDEPENDENT_AMBULATORY_CARE_PROVIDER_SITE_OTHER): Payer: Medicaid Other | Admitting: Obstetrics and Gynecology

## 2012-04-10 VITALS — BP 123/77 | HR 95 | Ht 66.0 in | Wt 166.0 lb

## 2012-04-10 DIAGNOSIS — Z3049 Encounter for surveillance of other contraceptives: Secondary | ICD-10-CM

## 2012-04-10 MED ORDER — MEDROXYPROGESTERONE ACETATE 150 MG/ML IM SUSP
150.0000 mg | Freq: Once | INTRAMUSCULAR | Status: AC
  Administered 2012-04-10: 150 mg via INTRAMUSCULAR

## 2012-04-10 NOTE — Progress Notes (Signed)
Post discharge review completed. 

## 2012-04-10 NOTE — Discharge Summary (Signed)
Obstetric Discharge Summary Reason for Admission: induction of labor Prenatal Procedures: none Intrapartum Procedures: spontaneous vaginal delivery Postpartum Procedures: none Complications-Operative and Postpartum: none Hemoglobin  Date Value Range Status  04/09/2012 11.1* 12.0 - 15.0 g/dL Final     HCT  Date Value Range Status  04/09/2012 32.8* 36.0 - 46.0 % Final    Physical Exam:  General: alert Lochia: appropriate Uterine Fundus: firm Incision: healing well DVT Evaluation: No evidence of DVT seen on physical exam.  Discharge Diagnoses: Term Pregnancy-delivered  Discharge Information: Date: 04/10/2012 Activity: pelvic rest Diet: routine Medications: Percocet Condition: stable Instructions: refer to practice specific booklet Discharge to: home Follow-up Information    Call in 6 weeks to follow up.   Contact information:   b Jean Ali         Newborn Data: Live born female  Birth Weight: 6 lb 7 oz (2920 g) APGAR: 9, 9  Home with mother.  Jean Ali A 04/10/2012, 7:06 AM

## 2012-04-11 NOTE — Clinical Social Work Psychosocial (Signed)
    Clinical Social Work Department BRIEF PSYCHOSOCIAL ASSESSMENT 04/11/2012  Patient:  Jean Ali, Jean Ali     Account Number:  [id number]     Admit date:  04/08/2012  Clinical Social Worker:  Andy Gauss  Date/Time:  04/10/2012 12:00 N  Referred by:  Physician  Date Referred:  04/10/2012 Referred for  Behavioral Health Issues   Other Referral:   Hx PP depression   Interview type:  Patient Other interview type:    PSYCHOSOCIAL DATA Living Status:  WITH MINOR CHILDREN Admitted from facility:   Level of care:   Primary support name:  Ival Bible Primary support relationship to patient:  FRIEND Degree of support available:   Involved    CURRENT CONCERNS Current Concerns  Behavioral Health Issues   Other Concerns:    SOCIAL WORK ASSESSMENT / PLAN CSW referral received to assess history of PP depression. Pt acknowledges that she experienced PP depression in 2006. She remembers feeling "down, sad and crying a lot."  She never sought counseling or took medication rather talked with her preacher, at the time.  She denies any SI. Pt admits to feeling some depression during the pregnancy and became tearful.  She did not wish to elaborate about how she was feeling at the moment.  FOB was present in the room, at her request.  Pt accepted referral information on counseling services and agrees to call if services needed. She has all the necessary supplies for the infant and good family support.  Pt was appropriate and appears to be attentive to the infant.  Sw available to assist further if needed.   Assessment/plan status:  No Further Intervention Required Other assessment/ plan:   Information/referral to community resources:   [facility]    PATIENT'S/FAMILY'S RESPONSE TO PLAN OF CARE:  Pt thanked CSW for resources.

## 2012-07-25 ENCOUNTER — Encounter (HOSPITAL_COMMUNITY): Payer: Self-pay | Admitting: *Deleted

## 2012-07-25 ENCOUNTER — Emergency Department (INDEPENDENT_AMBULATORY_CARE_PROVIDER_SITE_OTHER): Payer: Medicaid Other

## 2012-07-25 ENCOUNTER — Emergency Department (HOSPITAL_COMMUNITY)
Admission: EM | Admit: 2012-07-25 | Discharge: 2012-07-25 | Disposition: A | Discharge status: Home / Self Care | Payer: Medicaid Other | Source: Home / Self Care | Attending: Family Medicine | Admitting: Family Medicine

## 2012-07-25 DIAGNOSIS — M533 Sacrococcygeal disorders, not elsewhere classified: Secondary | ICD-10-CM

## 2012-07-25 MED ORDER — DICLOFENAC POTASSIUM 50 MG PO TABS: 50.0000 mg | ORAL_TABLET | Freq: Three times a day (TID) | ORAL | Status: DC

## 2012-07-25 NOTE — ED Provider Notes (Signed)
History     CSN: [id number]  Arrival date & time 07/25/12  1402   First MD Initiated Contact with Patient 07/25/12 1407      Chief Complaint  Patient presents with  . Tailbone Pain    (Consider location/radiation/quality/duration/timing/severity/associated sxs/prior treatment) Patient is a 27 y.o. female presenting with back pain. The history is provided by the patient.  Back Pain Location:  Gluteal region Quality:  Stabbing Radiates to:  Does not radiate Pain severity:  Moderate Onset quality: onset while in hosp for routine childbirth-vag. in nov 2013., getting worse since, no pain with bm only with sitting. Progression:  Worsening Chronicity:  New Context comment:  Childbirth. Ineffective treatments:  Narcotics and NSAIDs Associated symptoms: no abdominal pain, no bladder incontinence, no bowel incontinence, no fever, no numbness, no paresthesias and no perianal numbness     Past Medical History  Diagnosis Date  . Asthma   . Hyperemesis gravidarum   . Fibroid   . Depression     pp 2006 no meds    Past Surgical History  Procedure Laterality Date  . Dilation and curettage of uterus    . Therapeutic abortion      Family History  Problem Relation Age of Onset  . Anesthesia problems Neg Hx   . Hypotension Neg Hx   . Malignant hyperthermia Neg Hx   . Pseudochol deficiency Neg Hx   . Asthma Sister   . Asthma Daughter   . Asthma Maternal Aunt   . Cancer Maternal Grandmother     History  Substance Use Topics  . Smoking status: Former Games developer  . Smokeless tobacco: Never Used  . Alcohol Use: No     Comment: stopped once she found out she was pregnant    OB History   Grav Para Term Preterm Abortions TAB SAB Ect Mult Living   4 2 2  2 1 1   2       Review of Systems  Constitutional: Negative.  Negative for fever.  Gastrointestinal: Negative for abdominal pain and bowel incontinence.  Genitourinary: Negative.  Negative for bladder incontinence.    Musculoskeletal: Positive for back pain.  Neurological: Negative for numbness and paresthesias.    Allergies  Penicillins and Tylenol  Home Medications   Current Outpatient Rx  Name  Route  Sig  Dispense  Refill  . diclofenac (CATAFLAM) 50 MG tablet   Oral   Take 1 tablet (50 mg total) by mouth 3 (three) times daily. Prn pain   30 tablet   0     BP 120/72  Pulse 72  Temp(Src) 98.6 F (37 C) (Oral)  Resp 16  SpO2 100%  LMP 07/25/2012  Breastfeeding? No  Physical Exam  Nursing note and vitals reviewed. Constitutional: She is oriented to person, place, and time. She appears well-developed and well-nourished.  Abdominal: Soft. Bowel sounds are normal.  Musculoskeletal: She exhibits tenderness.       Back:  Neurological: She is alert and oriented to person, place, and time.  Skin: Skin is warm and dry.    ED Course  Procedures (including critical care time)  Labs Reviewed - No data to display Dg Sacrum/coccyx  07/25/2012  *RADIOLOGY REPORT*  Clinical Data: Pain  SACRUM AND COCCYX - 2+ VIEW  Comparison: None.  Findings: Frontal and lateral views were obtained.  There is no fracture or diastasis.  Joint spaces appear intact.  No erosive change.  IMPRESSION: No abnormality noted.   Original Report Authenticated By: Chrissie Noa  Margarita Grizzle, M.D.      1. Coccydynia       MDM  X-rays reviewed and report per radiologist.         Linna Hoff, MD 07/25/12 [phone]

## 2012-07-25 NOTE — ED Notes (Signed)
Pt  Reports  Lower  saceal pain  For  About  4  Months  After  Childbirth  She  denys  Any  specefic  injury  She   denys  Any  Drainage  Or  Any  Other  Symptoms           She  Reports  painis  Worse  On  Pressure  Or  When she  Sits  For  Extended  Periods    She  Walks  Upright  With a  Steady fluid gait

## 2012-07-25 NOTE — ED Notes (Signed)
chaparoned dr Artis Flock during exam.

## 2012-08-29 ENCOUNTER — Encounter (HOSPITAL_COMMUNITY): Payer: Self-pay | Admitting: Emergency Medicine

## 2012-08-29 ENCOUNTER — Emergency Department (INDEPENDENT_AMBULATORY_CARE_PROVIDER_SITE_OTHER)
Admission: EM | Admit: 2012-08-29 | Discharge: 2012-08-29 | Disposition: A | Discharge status: Home / Self Care | Payer: Self-pay | Source: Home / Self Care | Attending: Emergency Medicine | Admitting: Emergency Medicine

## 2012-08-29 DIAGNOSIS — R05 Cough: Secondary | ICD-10-CM

## 2012-08-29 MED ORDER — GUAIFENESIN-CODEINE 100-10 MG/5ML PO SYRP: 5.0000 mL | ORAL_SOLUTION | Freq: Three times a day (TID) | ORAL | Status: DC | PRN

## 2012-08-29 MED ORDER — FEXOFENADINE-PSEUDOEPHED ER 60-120 MG PO TB12: 1.0000 | ORAL_TABLET | Freq: Two times a day (BID) | ORAL | Status: DC

## 2012-08-29 NOTE — ED Provider Notes (Signed)
History     CSN: [id number]  Arrival date & time 08/29/12  1230   First MD Initiated Contact with Patient 08/29/12 1237      Chief Complaint  Patient presents with  . Cough    (Consider location/radiation/quality/duration/timing/severity/associated sxs/prior treatment) HPI Comments: Patient presents urgent care describing that Saturday she developed for episodes of diarrhea. In that Friday she started with a cough in having congested nose. Denies any wheezing or shortness of breath tactile fevers. Denies any pain or vomiting. Have felt some nausea but that has improved. Denies, abdominal pain at this moment. Does have mild congestion of her nose or sore throat and a recurrent cough. Is also requesting a note for work  Patient is a 27 y.o. female presenting with cough. The history is provided by the patient.  Cough Cough characteristics:  Harsh Severity:  Mild Onset quality:  Sudden Timing:  Intermittent Progression:  Waxing and waning Chronicity:  New Context: not exposure to allergens, not fumes, not sick contacts, not weather changes and not with activity   Relieved by:  Nothing Worsened by:  Nothing tried Associated symptoms: sore throat   Associated symptoms: no chills, no diaphoresis, no ear pain, no fever, no myalgias, no rash, no shortness of breath, no weight loss and no wheezing     Past Medical History  Diagnosis Date  . Asthma   . Hyperemesis gravidarum   . Fibroid   . Depression     pp 2006 no meds    Past Surgical History  Procedure Laterality Date  . Dilation and curettage of uterus    . Therapeutic abortion      Family History  Problem Relation Age of Onset  . Anesthesia problems Neg Hx   . Hypotension Neg Hx   . Malignant hyperthermia Neg Hx   . Pseudochol deficiency Neg Hx   . Asthma Sister   . Asthma Daughter   . Asthma Maternal Aunt   . Cancer Maternal Grandmother     History  Substance Use Topics  . Smoking status: Former Games developer  .  Smokeless tobacco: Never Used  . Alcohol Use: No     Comment: stopped once she found out she was pregnant    OB History   Grav Para Term Preterm Abortions TAB SAB Ect Mult Living   4 2 2  2 1 1   2       Review of Systems  Constitutional: Negative for fever, chills, weight loss, diaphoresis and activity change.  HENT: Positive for sore throat. Negative for ear pain and neck pain.   Respiratory: Positive for cough. Negative for apnea, chest tightness, shortness of breath and wheezing.   Gastrointestinal: Positive for nausea and diarrhea. Negative for vomiting, abdominal pain and anal bleeding.  Musculoskeletal: Negative for myalgias, back pain, joint swelling and arthralgias.  Skin: Negative for rash.  Hematological: Negative for adenopathy. Does not bruise/bleed easily.    Allergies  Penicillins and Tylenol  Home Medications   Current Outpatient Rx  Name  Route  Sig  Dispense  Refill  . fexofenadine-pseudoephedrine (ALLEGRA-D) 60-120 MG per tablet   Oral   Take 1 tablet by mouth every 12 (twelve) hours.   14 tablet   0   . guaiFENesin-codeine (ROBITUSSIN AC) 100-10 MG/5ML syrup   Oral   Take 5 mLs by mouth 3 (three) times daily as needed for cough.   120 mL   0     BP 108/57  Pulse 88  Temp(Src) 97.6  F (36.4 C) (Oral)  Resp 16  SpO2 100%  LMP 08/17/2012  Physical Exam  Nursing note and vitals reviewed. Constitutional: Vital signs are normal. She appears well-developed and well-nourished.  Non-toxic appearance. She does not have a sickly appearance. She does not appear ill. No distress.  HENT:  Head: Normocephalic.  Right Ear: Tympanic membrane normal.  Left Ear: Tympanic membrane normal.  Nose: Rhinorrhea present.  Mouth/Throat: Uvula is midline. Posterior oropharyngeal erythema present. No oropharyngeal exudate or posterior oropharyngeal edema.  Eyes: Conjunctivae are normal. Right eye exhibits no discharge. Left eye exhibits no discharge.  Neck: Neck  supple.  Cardiovascular: Normal rate.   Pulmonary/Chest: Effort normal and breath sounds normal.  Abdominal: Soft. She exhibits no distension. There is no tenderness. There is no rebound.  Skin: No rash noted. No erythema.    ED Course  Procedures (including critical care time)  Labs Reviewed - No data to display No results found.   1. Cough       MDM  Mild respiratory symptoms and resolved diarrhea. Most likely viral versus allergy related symptoms patient has been prescribed an antihistamine and antitussive syrup. Otherwise about symptoms should warrant return if needed. Patient looks comfortable afebrile and in no respiratory distress with a soft abdomen        Jimmie Molly, MD 08/29/12 1358

## 2012-08-29 NOTE — ED Notes (Signed)
Friday onset of coughing, then Saturday diarrhea started.  Reports 4 diarrhea stools today.  Denies vomiting.  Does feel nauseated at times.  Reports runny nose, sore throat and cough, denies fever.

## 2013-03-25 ENCOUNTER — Emergency Department (INDEPENDENT_AMBULATORY_CARE_PROVIDER_SITE_OTHER)
Admission: EM | Admit: 2013-03-25 | Discharge: 2013-03-25 | Disposition: A | Discharge status: Home / Self Care | Payer: Medicaid Other | Source: Home / Self Care | Attending: Family Medicine | Admitting: Family Medicine

## 2013-03-25 ENCOUNTER — Encounter (HOSPITAL_COMMUNITY): Payer: Self-pay | Admitting: Emergency Medicine

## 2013-03-25 DIAGNOSIS — IMO0002 Reserved for concepts with insufficient information to code with codable children: Secondary | ICD-10-CM

## 2013-03-25 MED ORDER — IBUPROFEN 800 MG PO TABS: 800.0000 mg | ORAL_TABLET | Freq: Three times a day (TID) | ORAL | Status: DC

## 2013-03-25 MED ORDER — CYCLOBENZAPRINE HCL 5 MG PO TABS: 5.0000 mg | ORAL_TABLET | Freq: Three times a day (TID) | ORAL | Status: DC | PRN
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
## 2013-04-01 ENCOUNTER — Emergency Department (INDEPENDENT_AMBULATORY_CARE_PROVIDER_SITE_OTHER)
Admission: EM | Admit: 2013-04-01 | Discharge: 2013-04-01 | Disposition: A | Discharge status: Home / Self Care | Payer: Medicaid Other | Source: Home / Self Care | Attending: Emergency Medicine | Admitting: Emergency Medicine

## 2013-04-01 ENCOUNTER — Encounter (HOSPITAL_COMMUNITY): Payer: Self-pay | Admitting: Emergency Medicine

## 2013-04-01 DIAGNOSIS — M545 Low back pain: Secondary | ICD-10-CM

## 2013-04-01 DIAGNOSIS — S161XXA Strain of muscle, fascia and tendon at neck level, initial encounter: Secondary | ICD-10-CM

## 2013-04-01 DIAGNOSIS — S139XXA Sprain of joints and ligaments of unspecified parts of neck, initial encounter: Secondary | ICD-10-CM

## 2013-04-01 NOTE — ED Provider Notes (Signed)
Medical screening examination/treatment/procedure(s) were performed by non-physician practitioner and as supervising physician I was immediately available for consultation/collaboration.  Leslee Home, M.D.  Reuben Likes, MD 04/01/13 [phone]

## 2013-04-01 NOTE — ED Notes (Signed)
Being treated with daughter in same treatment room
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
## 2013-04-08 ENCOUNTER — Emergency Department (HOSPITAL_COMMUNITY)
Admission: EM | Admit: 2013-04-08 | Discharge: 2013-04-08 | Disposition: A | Discharge status: Home / Self Care | Payer: Medicaid Other | Attending: Emergency Medicine | Admitting: Emergency Medicine

## 2013-04-08 ENCOUNTER — Encounter (HOSPITAL_COMMUNITY): Payer: Self-pay | Admitting: Emergency Medicine

## 2013-04-08 DIAGNOSIS — S01112A Laceration without foreign body of left eyelid and periocular area, initial encounter: Secondary | ICD-10-CM

## 2013-04-08 DIAGNOSIS — W2209XA Striking against other stationary object, initial encounter: Secondary | ICD-10-CM | POA: Insufficient documentation

## 2013-04-08 DIAGNOSIS — S0180XA Unspecified open wound of other part of head, initial encounter: Secondary | ICD-10-CM | POA: Insufficient documentation

## 2013-04-08 DIAGNOSIS — Y9302 Activity, running: Secondary | ICD-10-CM | POA: Insufficient documentation

## 2013-04-08 DIAGNOSIS — Z87891 Personal history of nicotine dependence: Secondary | ICD-10-CM | POA: Insufficient documentation

## 2013-04-08 DIAGNOSIS — J45909 Unspecified asthma, uncomplicated: Secondary | ICD-10-CM | POA: Insufficient documentation

## 2013-04-08 DIAGNOSIS — Z79899 Other long term (current) drug therapy: Secondary | ICD-10-CM | POA: Insufficient documentation

## 2013-04-08 DIAGNOSIS — Z880 Allergy status to penicillin: Secondary | ICD-10-CM | POA: Insufficient documentation

## 2013-04-08 DIAGNOSIS — Z8742 Personal history of other diseases of the female genital tract: Secondary | ICD-10-CM | POA: Insufficient documentation

## 2013-04-08 DIAGNOSIS — Y929 Unspecified place or not applicable: Secondary | ICD-10-CM | POA: Insufficient documentation

## 2013-04-08 NOTE — ED Provider Notes (Signed)
CSN: [id number]     Arrival date & time 04/08/13  0135 History   First MD Initiated Contact with Patient 04/08/13 0357     Chief Complaint  Patient presents with  . Laceration   (Consider location/radiation/quality/duration/timing/severity/associated sxs/prior Treatment) HPI 27 year old female presents to emergency room with complaint of left eyebrow laceration.  Patient reports that she was bent over, tying her shoe while she also was opening a door.  She did not realize that she cut her eyebrow, until she was on her way into work.  Tetanus is up-to-date.  She denies any other complaints Past Medical History  Diagnosis Date  . Asthma   . Hyperemesis gravidarum   . Fibroid   . Depression     pp 2006 no meds   Past Surgical History  Procedure Laterality Date  . Dilation and curettage of uterus    . Therapeutic abortion     Family History  Problem Relation Age of Onset  . Anesthesia problems Neg Hx   . Hypotension Neg Hx   . Malignant hyperthermia Neg Hx   . Pseudochol deficiency Neg Hx   . Asthma Sister   . Asthma Daughter   . Asthma Maternal Aunt   . Cancer Maternal Grandmother    History  Substance Use Topics  . Smoking status: Former Games developer  . Smokeless tobacco: Never Used  . Alcohol Use: No     Comment: stopped once she found out she was pregnant   OB History   Grav Para Term Preterm Abortions TAB SAB Ect Mult Living   4 2 2  2 1 1   2      Review of Systems  All other systems reviewed and are negative.    Allergies  Penicillins and Tylenol  Home Medications   Current Outpatient Rx  Name  Route  Sig  Dispense  Refill  . cyclobenzaprine (FLEXERIL) 5 MG tablet   Oral   Take 1 tablet (5 mg total) by mouth 3 (three) times daily as needed for muscle spasms.   30 tablet   0   . fexofenadine-pseudoephedrine (ALLEGRA-D) 60-120 MG per tablet   Oral   Take 1 tablet by mouth every 12 (twelve) hours.   14 tablet   0   . guaiFENesin-codeine (ROBITUSSIN AC)  100-10 MG/5ML syrup   Oral   Take 5 mLs by mouth 3 (three) times daily as needed for cough.   120 mL   0   . ibuprofen (ADVIL,MOTRIN) 800 MG tablet   Oral   Take 1 tablet (800 mg total) by mouth 3 (three) times daily. For back soreness   30 tablet   0    BP 126/82  Pulse 77  Temp(Src) 98.1 F (36.7 C) (Oral)  Resp 16  SpO2 99%  LMP 03/01/2013 Physical Exam  Constitutional: She is oriented to person, place, and time. She appears well-developed and well-nourished. No distress.  HENT:  Head: Normocephalic.  Right Ear: External ear normal.  Left Ear: External ear normal.  Nose: Nose normal.  Mouth/Throat: Oropharynx is clear and moist. No oropharyngeal exudate.  1 cm laceration to left mid eyebrow  Eyes: Conjunctivae and EOM are normal. Pupils are equal, round, and reactive to light.  Neck: Normal range of motion. Neck supple. No JVD present. No tracheal deviation present. No thyromegaly present.  Pulmonary/Chest: No stridor.  Musculoskeletal: Normal range of motion. She exhibits no edema and no tenderness.  Lymphadenopathy:    She has no cervical adenopathy.  Neurological: She is alert and oriented to person, place, and time.  Skin: Skin is warm and dry. No rash noted. No erythema. No pallor.  Psychiatric: She has a normal mood and affect. Her behavior is normal. Judgment and thought content normal.    ED Course  Procedures (including critical care time) Labs Review Labs Reviewed - No data to display Imaging Review No results found.  EKG Interpretation   None      LACERATION REPAIR Performed by: Olivia Mackie Authorized by: Olivia Mackie Consent: Verbal consent obtained. Risks and benefits: risks, benefits and alternatives were discussed Consent given by: patient Patient identity confirmed: provided demographic data Prepped and Draped in normal sterile fashion Wound explored  Laceration Location: left eyebrow  Laceration Length: 1.0 cm  No Foreign Bodies  seen or palpated  Anesthesia: local infiltration  Local anesthetic: lidocaine 2% with epinephrine  Anesthetic total: 2 ml  Irrigation method: syringe Amount of cleaning: standard  Skin closure: 5.0 vicryl rapide  Number of sutures: 2  Technique: simple interrupted  Patient tolerance: Patient tolerated the procedure well with no immediate complications.  MDM   1. Laceration of left eyebrow, initial encounter    Patient with laceration to left eyebrow, repaired with 5.0 Vicryl Rapide.  Should Visine sutures do not fall on her own in 7 days.  She should return to have them removed.    Olivia Mackie, MD 04/09/13 [id number]

## 2013-04-08 NOTE — ED Notes (Signed)
Pt states she ran into wall, and now she has noted LAC above left eye on eyebrow. Aproximately quarter of inch in length. Bleeding is controlled

## 2013-04-08 NOTE — ED Notes (Signed)
Pt given d/c instructions and verbalized understanding. NAD at this time.  

## 2013-05-31 ENCOUNTER — Emergency Department (HOSPITAL_COMMUNITY)
Admission: EM | Admit: 2013-05-31 | Discharge: 2013-05-31 | Disposition: A | Discharge status: Home / Self Care | Payer: Self-pay | Source: Home / Self Care

## 2013-05-31 ENCOUNTER — Encounter (HOSPITAL_COMMUNITY): Payer: Self-pay | Admitting: Emergency Medicine

## 2013-05-31 DIAGNOSIS — J111 Influenza due to unidentified influenza virus with other respiratory manifestations: Secondary | ICD-10-CM

## 2013-05-31 LAB — POCT RAPID STREP A: Streptococcus, Group A Screen (Direct): NEGATIVE

## 2013-05-31 MED ORDER — IBUPROFEN 800 MG PO TABS
ORAL_TABLET | ORAL | Status: AC
Start: 2013-05-31 — End: 2013-05-31
  Filled 2013-05-31: qty 1

## 2013-05-31 MED ORDER — IBUPROFEN 800 MG PO TABS
800.0000 mg | ORAL_TABLET | Freq: Once | ORAL | Status: AC
  Administered 2013-05-31: 800 mg via ORAL

## 2013-05-31 MED ORDER — OSELTAMIVIR PHOSPHATE 75 MG PO CAPS: 75.0000 mg | ORAL_CAPSULE | Freq: Two times a day (BID) | ORAL | Status: DC

## 2013-05-31 NOTE — Discharge Instructions (Signed)
You likely have the flu Please start on Tamiflu for your condition Tamiflu will shorten the length (not decrease symptoms) of your overall illness Please also consider using sudafed, tylenol, ibuprofen for your other symptoms

## 2013-05-31 NOTE — ED Provider Notes (Signed)
CSN: [id number]     Arrival date & time 05/31/13  1947 History   None    Chief Complaint  Patient presents with  . Chest Pain   (Consider location/radiation/quality/duration/timing/severity/associated sxs/prior Treatment) HPI  Body aches: onset yesterday around 20:00. Getting worse. Associated w/ Dry cough, runny nose, sore throat, fever (102.4), and HA. Denies sick contacts, nausea, vomiting, diarrhea. Decreased PO. Voiding and stooling nml. No flu shot this year. mucinex w/o benefit.    Past Medical History  Diagnosis Date  . Asthma   . Hyperemesis gravidarum   . Fibroid   . Depression     pp 2006 no meds   Past Surgical History  Procedure Laterality Date  . Dilation and curettage of uterus    . Therapeutic abortion     Family History  Problem Relation Age of Onset  . Anesthesia problems Neg Hx   . Hypotension Neg Hx   . Malignant hyperthermia Neg Hx   . Pseudochol deficiency Neg Hx   . Asthma Sister   . Asthma Daughter   . Asthma Maternal Aunt   . Cancer Maternal Grandmother    History  Substance Use Topics  . Smoking status: Former Research scientist (life sciences)  . Smokeless tobacco: Never Used  . Alcohol Use: No     Comment: stopped once she found out she was pregnant   OB History   Grav Para Term Preterm Abortions TAB SAB Ect Mult Living   4 2 2  2 1 1   2      Review of Systems  Constitutional: Positive for fever, chills, diaphoresis, activity change, appetite change and fatigue.  Gastrointestinal: Negative for nausea and vomiting.  Genitourinary: Negative for dysuria.  All other systems reviewed and are negative.    Allergies  Penicillins  Home Medications   Current Outpatient Rx  Name  Route  Sig  Dispense  Refill  . Phenylephrine-APAP-Guaifenesin (MUCINEX FAST-MAX COLD & SINUS) 10-650-400 MG/20ML LIQD   Oral   Take by mouth.         . cyclobenzaprine (FLEXERIL) 5 MG tablet   Oral   Take 1 tablet (5 mg total) by mouth 3 (three) times daily as needed for muscle  spasms.   30 tablet   0   . fexofenadine-pseudoephedrine (ALLEGRA-D) 60-120 MG per tablet   Oral   Take 1 tablet by mouth every 12 (twelve) hours.   14 tablet   0   . guaiFENesin-codeine (ROBITUSSIN AC) 100-10 MG/5ML syrup   Oral   Take 5 mLs by mouth 3 (three) times daily as needed for cough.   120 mL   0   . ibuprofen (ADVIL,MOTRIN) 800 MG tablet   Oral   Take 1 tablet (800 mg total) by mouth 3 (three) times daily. For back soreness   30 tablet   0   . oseltamivir (TAMIFLU) 75 MG capsule   Oral   Take 1 capsule (75 mg total) by mouth 2 (two) times daily.   10 capsule   0    BP 127/74  Pulse 104  Temp(Src) 102.4 F (39.1 C) (Oral)  Resp 16  SpO2 100% Physical Exam  Constitutional: She is oriented to person, place, and time. She appears well-developed and well-nourished. No distress.  HENT:  Head: Normocephalic and atraumatic.  Right Ear: External ear normal.  Left Ear: External ear normal.  Nose: Nose normal.  Pharynx w/ cobblestoning  Eyes: EOM are normal. Pupils are equal, round, and reactive to light.  Neck: Normal  range of motion.  Cardiovascular: Normal rate, normal heart sounds and intact distal pulses.  Exam reveals no gallop.   No murmur heard. Pulmonary/Chest: Effort normal. No respiratory distress. She has no wheezes. She has no rales. She exhibits no tenderness.  Abdominal: Soft.  Musculoskeletal: Normal range of motion. She exhibits no tenderness.  Neurological: She is alert and oriented to person, place, and time.  Skin: Skin is warm and dry. No rash noted. No erythema. No pallor.  Psychiatric: She has a normal mood and affect. Her behavior is normal. Judgment and thought content normal.    ED Course  Procedures (including critical care time) Labs Review Labs Reviewed  POCT RAPID STREP A ([place])   Imaging Review No results found.  EKG Interpretation    Date/Time:    Ventricular Rate:    PR Interval:    QRS Duration:   QT  Interval:    QTC Calculation:   R Axis:     Text Interpretation:              MDM   1. Flu    28yo F w/ the flu. - Tamiflu - NSAIDs, tylenol, sudafed, mucinex PRN - rest adn fluids - precautions given adn all questions answered  Linna Darner, MD Family Medicine PGY-3 05/31/2013, 8:59 PM      Waldemar Dickens, MD 05/31/13 2059

## 2013-05-31 NOTE — ED Notes (Signed)
C/o coughing last night, vomiting mucous.  Went to work today.  Started having chest pain @ 1300, SOB and anxiety attack.  The ambulance was called and they calmed her down.  C/o chills and fever.

## 2013-06-02 LAB — CULTURE, GROUP A STREP

## 2013-06-05 NOTE — ED Provider Notes (Signed)
Medical screening examination/treatment/procedure(s) were performed by resident physician or non-physician practitioner and as supervising physician I was immediately available for consultation/collaboration.   Pauline Good MD.   Billy Fischer, MD 06/05/13 [phone]

## 2013-12-06 LAB — PROCEDURE REPORT - SCANNED: PAP SMEAR: NEGATIVE

## 2014-01-01 ENCOUNTER — Emergency Department (HOSPITAL_COMMUNITY): Payer: Medicaid Other

## 2014-01-01 ENCOUNTER — Emergency Department (HOSPITAL_COMMUNITY)
Admission: EM | Admit: 2014-01-01 | Discharge: 2014-01-01 | Disposition: A | Discharge status: Home / Self Care | Payer: Medicaid Other | Attending: Emergency Medicine | Admitting: Emergency Medicine

## 2014-01-01 ENCOUNTER — Encounter (HOSPITAL_COMMUNITY): Payer: Self-pay | Admitting: Emergency Medicine

## 2014-01-01 DIAGNOSIS — Z87891 Personal history of nicotine dependence: Secondary | ICD-10-CM | POA: Diagnosis not present

## 2014-01-01 DIAGNOSIS — Z880 Allergy status to penicillin: Secondary | ICD-10-CM | POA: Diagnosis not present

## 2014-01-01 DIAGNOSIS — S59919A Unspecified injury of unspecified forearm, initial encounter: Secondary | ICD-10-CM

## 2014-01-01 DIAGNOSIS — S99919A Unspecified injury of unspecified ankle, initial encounter: Secondary | ICD-10-CM | POA: Diagnosis present

## 2014-01-01 DIAGNOSIS — S8000XA Contusion of unspecified knee, initial encounter: Secondary | ICD-10-CM | POA: Diagnosis not present

## 2014-01-01 DIAGNOSIS — Z8659 Personal history of other mental and behavioral disorders: Secondary | ICD-10-CM | POA: Insufficient documentation

## 2014-01-01 DIAGNOSIS — S99929A Unspecified injury of unspecified foot, initial encounter: Secondary | ICD-10-CM

## 2014-01-01 DIAGNOSIS — S8990XA Unspecified injury of unspecified lower leg, initial encounter: Secondary | ICD-10-CM | POA: Insufficient documentation

## 2014-01-01 DIAGNOSIS — Z8742 Personal history of other diseases of the female genital tract: Secondary | ICD-10-CM | POA: Insufficient documentation

## 2014-01-01 DIAGNOSIS — S59909A Unspecified injury of unspecified elbow, initial encounter: Secondary | ICD-10-CM | POA: Diagnosis not present

## 2014-01-01 DIAGNOSIS — J45909 Unspecified asthma, uncomplicated: Secondary | ICD-10-CM | POA: Insufficient documentation

## 2014-01-01 DIAGNOSIS — S8001XA Contusion of right knee, initial encounter: Secondary | ICD-10-CM

## 2014-01-01 DIAGNOSIS — M25531 Pain in right wrist: Secondary | ICD-10-CM

## 2014-01-01 DIAGNOSIS — S6990XA Unspecified injury of unspecified wrist, hand and finger(s), initial encounter: Secondary | ICD-10-CM | POA: Diagnosis not present

## 2014-01-01 MED ORDER — NAPROXEN 500 MG PO TABS: 500.0000 mg | ORAL_TABLET | Freq: Two times a day (BID) | ORAL | Status: DC

## 2014-01-01 MED ORDER — HYDROCODONE-ACETAMINOPHEN 5-325 MG PO TABS: 1.0000 | ORAL_TABLET | ORAL | Status: DC | PRN

## 2014-01-01 NOTE — Discharge Instructions (Signed)
Call Dr. Amedeo Plenty for further evaluation of your wrist pain.  Do not remove your splint until you are told to do so by Dr. Amedeo Plenty. Call Dr. Ninfa Linden for further evaluation of your knee pain. Ice your knee and wrist 3-4 times a day.  Elevate your hand and knee above your head when you are not walking or standing. Call for a follow up appointment with a Family or Primary Care Provider.  Return if Symptoms worsen.   Take medication as prescribed.  Use your crutches as indicated.   Emergency Department Resource Guide 1) Find a Doctor and Pay Out of Pocket Although you won't have to find out who is covered by your insurance plan, it is a good idea to ask around and get recommendations. You will then need to call the office and see if the doctor you have chosen will accept you as a new patient and what types of options they offer for patients who are self-pay. Some doctors offer discounts or will set up payment plans for their patients who do not have insurance, but you will need to ask so you aren't surprised when you get to your appointment.  2) Contact Your Local Health Department Not all health departments have doctors that can see patients for sick visits, but many do, so it is worth a call to see if yours does. If you don't know where your local health department is, you can check in your phone book. The CDC also has a tool to help you locate your state's health department, and many state websites also have listings of all of their local health departments.  3) Find a [facility] If your illness is not likely to be very severe or complicated, you may want to try a walk in clinic. These are popping up all over the country in pharmacies, drugstores, and shopping centers. They're usually staffed by nurse practitioners or physician assistants that have been trained to treat common illnesses and complaints. They're usually fairly quick and inexpensive. However, if you have serious medical issues or  chronic medical problems, these are probably not your best option.  No Primary Care Doctor: - Call Health Connect at  [phone] - they can help you locate a primary care doctor that  accepts your insurance, provides certain services, etc. - Physician Referral Service- [phone]  Chronic Pain Problems: Organization         Address  Phone   Notes  [facility]  [phone] Patients need to be referred by their primary care doctor.   Medication Assistance: Organization         Address  Phone   Notes  [facility] Medication [facility] [address] [place], [place] [address] [phone] --Must be a resident of [facility] -- Must have NO insurance coverage whatsoever (no Medicaid/ Medicare, etc.) -- The pt. MUST have a primary care doctor that directs their care regularly and follows them in the community   MedAssist  [phone]   [employer]  [phone]    Agencies that provide inexpensive medical care: Organization         Address  Phone   Notes  [place]  [phone]   [facility]    [phone]   [facility] [place], [place] [address] [phone]   [place] [address] [place] [phone]   Planned Parenthood    (  [phone]   [facility]    [phone]   [facility]  [address], [place] Phone:  [phone], Fax:  [phone] Hours of Operation:  9 am - 6 pm, M-F.  Also accepts Medicaid/Medicare and self-pay.  [facility] for [place], [address], [place] Phone: [phone], Fax: [phone]. Hours of Operation:  8:30 am - 5:30 pm, M-F.  Also accepts Medicaid and self-pay.  [facility] [place] [address], [place] Phone: [phone]   [place], [place], [place]  [phone], Ext. [phone] Mondays & Thursdays: 7-9 AM.  First 15 patients are seen on a first come, first serve basis.    [place] Providers:  Organization         Address  Phone   Notes  [facility] [address], [place] [phone] Also accepts self-pay patients.  [facility] [address], [place]  [phone]   [place], [address], [place] [phone]   [facility] [address] [place] [phone]   Lucianne Lei [address], [place]   [phone] Only accepts [place] Access [place] patients after they have their name applied to their card.   Self-Pay (no insurance) in [facility]:  Organization         Address  Phone   Notes  Sickle Cell Patients, [facility] [place] [phone]   [facility] [place] [phone]   [facility] [place]  [place], [place], [place] [phone]   [facility]/Dr. Osei-Bonsu  [address] [place] or [address], [place] [phone] Phone number for both [place] and [place] locations is the same.  [facility] [address] [place] [phone]   [facility] [address] [place] or [address] [phone]   [facility] [address] [place] [phone], phone; [phone], fax Sees patients 1st and 3rd Saturday of every month.  Must not qualify for public or private insurance (i.e. Medicaid, Medicare, [place] Health Choice, Veterans' Benefits)  Household income should be no more than 200% of the poverty level The clinic cannot treat you if you are pregnant or think you are pregnant  Sexually transmitted  diseases are not treated at the clinic.    Dental Care: Organization         Address  Phone  Notes  [facility] Department of [facility] [place] [phone] Accepts children up to age 73 who are enrolled in [place] or [place]; pregnant women with a Medicaid card; and children who have applied for Medicaid or [place] Health Choice, but were declined, whose parents can pay a reduced fee at time of service.  [facility] Department of [facility]  [address], [place] [phone] Accepts children up to age 14 who are enrolled in [place] or [place]; pregnant women with a Medicaid card; and children who have applied for Medicaid or [place], but were declined, whose parents can pay a  reduced fee at time of service.  [place] Adult Dental Access PROGRAM  [place] [phone] Patients are seen by appointment only. Walk-ins are not accepted. [place] will see patients 42 years of age and older. Monday - Tuesday (8am-5pm) Most Wednesdays (8:30-5pm) $30 per visit, cash only  [facility] Adult Dental Access PROGRAM  [address], [facility] [phone] Patients are seen by appointment only. Walk-ins are not accepted. [place] will see patients 71 years of age and older. One Wednesday Evening (Monthly: Volunteer Based).  $30 per visit, cash only  [place]  [phone] for adults; Children under age 66, call Graduate Pediatric Dentistry at [phone]. Children aged 12-14, please call [phone] to request a pediatric application.  Dental services are provided in all areas of dental care including fillings, crowns and bridges, complete and partial dentures, implants, gum treatment, root canals, and extractions. Preventive care is also provided. Treatment is provided to both adults and children. Patients are selected via a  lottery and there is often a waiting list.   [facility] [address], [place]  [phone] [email or web address]   [facility] [address] [place], [place] [phone], Ext. [phone] Second and Fourth Thursday of each month, opens at 6:30 AM; Clinic ends at 9 AM.  Patients are seen on a first-come first-served basis, and a limited number are seen during each clinic.   [facility]  [address] [place], [place] [phone]   Eligibility Requirements You must have lived in [place], [place], or [place] counties for at least the last three months.   You cannot be eligible for state or federal sponsored [employer], including [employer], [place], or [employer].   You generally cannot be eligible for healthcare insurance through your employer.    How to apply: Eligibility screenings are held every Tuesday and Wednesday afternoon from 1:00 pm until 4:00 pm. You do not need an appointment for the interview!  [facility] [address] [place], [place]   [place]  [place] Department  [place]  [phone]    Behavioral Health Resources in the Community: Intensive Outpatient Programs Organization         Address  Phone  Notes  [place]. [address], [place], [place] [phone]   [facility] Outpatient [address] [place], [place]   ADS: Alcohol & Drug Svcs [address] [place], [place]   [place] [address],  [place], [place] or [phone]   Substance Abuse Resources Organization         Address  Phone  Notes  Alcohol and Drug Services  [phone]   [place]  [phone]   The [place]   Chinita Pester  [phone]   Residential &  Outpatient Substance Abuse Program  [phone]   Psychological Services Organization         Address  Phone  Notes  [facility] [place]  [place]  [phone]   [place] [address] [place] or [phone]    Mobile Crisis Teams Organization         Address  Phone  Notes  [facility], Mobile Crisis Care Unit  [phone]  [facility]  [address] [place], [place]   [facility] [address], [place] [phone]    Self-Help/Support Groups Organization         Address  Phone             Notes  Mental Health Assoc. of [place] - variety of support groups  [place] Call for more information  Narcotics Anonymous (NA), [facility] [address], [employer] [place]  2 meetings at this location   Special educational needs teacher         Address  Phone  Notes  ASAP Residential Treatment [place],    [place]  [phone]   [facility]  [address] [place] [phone], [place], [place]   [place], [place] [phone] Admissions: 8am-3pm M-F  Incentives Substance [place] [address]    [place], [place] [phone]   [facility] [address] [place], [place], [place]   [facility] [address]  [place], [place]   [facility] Programs - Intensive Outpatient [address], [place], [place]   [facility] [place].,  [place], [place] [phone] or [phone]   [facility] Services (RTS) [address] [place], [place] Accepts Medicaid  [facility] [place] [address]  [place] [phone] Substance Abuse/Addiction Treatment   [facility] Organization          Address  Phone  Notes  [facility]  [phone]   Domenic Schwab, PhD [address] Arlis Porta [place], [place]   [phone] or [phone]   [facility] [place], [place] [phone]   [facility] [address] [place], [place] [phone] Insurance/Medicaid/sponsorship through [employer] [facility] and Families [address]                                    [place], [place] [phone] [place] [address] [place] Court[place] Harbor, [place] [phone]    Dr. Adele Schilder  [phone]   [facility] Dept. 1) [address], [place] 2) [place] 3)  [place] [address], [place] [phone]  [phone]   [place] [phone] or [phone] (After Hours)
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
## 2014-01-08 NOTE — ED Provider Notes (Signed)
Medical screening examination/treatment/procedure(s) were performed by non-physician practitioner and as supervising physician I was immediately available for consultation/collaboration.   EKG Interpretation None        Tanna Furry, MD 01/08/14 [phone]

## 2014-03-18 ENCOUNTER — Encounter (HOSPITAL_COMMUNITY): Payer: Self-pay | Admitting: Emergency Medicine

## 2014-05-30 ENCOUNTER — Emergency Department (HOSPITAL_COMMUNITY)
Admission: EM | Admit: 2014-05-30 | Discharge: 2014-05-30 | Disposition: A | Discharge status: Home / Self Care | Payer: Medicaid Other | Source: Home / Self Care | Attending: Family Medicine | Admitting: Family Medicine

## 2014-05-30 ENCOUNTER — Encounter (HOSPITAL_COMMUNITY): Payer: Self-pay | Admitting: Emergency Medicine

## 2014-05-30 DIAGNOSIS — S01511A Laceration without foreign body of lip, initial encounter: Secondary | ICD-10-CM

## 2014-05-30 DIAGNOSIS — S60222A Contusion of left hand, initial encounter: Secondary | ICD-10-CM
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
## 2014-05-30 NOTE — ED Notes (Signed)
Pt left before i was able to go over discharge information with her.
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
## 2014-05-30 NOTE — Discharge Instructions (Signed)
Mouth Laceration A mouth laceration is a cut inside the mouth. TREATMENT  Because of all the bacteria in the mouth, lacerations are usually not stitched (sutured) unless the wound is gaping open. Sometimes, a couple sutures may be placed just to hold the edges of the wound together and to speed healing. Over the next 1 to 2 days, you will see that the wound edges appear gray in color. The edges may appear ragged and slightly spread apart. Because of all the normal bacteria in the mouth, these wounds are contaminated, but this is not an infection that needs antibiotics. Most wounds heal with no problems despite their appearance. HOME CARE INSTRUCTIONS   Rinse your mouth with a warm, saltwater wash 4 to 6 times per day, or as your caregiver instructs.  Continue oral hygiene and gentle tooth brushing as normal, if possible.  Do not eat or drink hot food or beverages while your mouth is still numb.  Eat a bland diet to avoid irritation from acidic foods.  Only take over-the-counter or prescription medicines for pain, discomfort, or fever as directed by your caregiver.  Follow up with your caregiver as instructed. You may need to see your caregiver for a wound check in 48 to 72 hours to make sure your wound is healing.  If your laceration was sutured, do not play with the sutures or knots with your tongue. If you do this, they will gradually loosen and may become untied. You may need a tetanus shot if:  You cannot remember when you had your last tetanus shot.  You have never had a tetanus shot. If you get a tetanus shot, your arm may swell, get red, and feel warm to the touch. This is common and not a problem. If you need a tetanus shot and you choose not to have one, there is a rare chance of getting tetanus. Sickness from tetanus can be serious. SEEK MEDICAL CARE IF:   You develop swelling or increasing pain in the wound or in other parts of your face.  You have a fever.  You develop  swollen, tender glands in the throat.  You notice the wound edges do not stay together after your sutures have been removed.  You see pus coming from the wound. Some drainage in the mouth is normal. MAKE SURE YOU:   Understand these instructions.  Will watch your condition.  Will get help right away if you are not doing well or get worse. Document Released: 05/03/2005 Document Revised: 07/26/2011 Document Reviewed: 11/05/2010 [facility] Patient Information 2015 [place], [place]. This information is not intended to replace advice given to you by your health care provider. Make sure you discuss any questions you have with your health care provider.  Hand Contusion A hand contusion is a deep bruise on your hand area. Contusions are the result of an injury that caused bleeding under the skin. The contusion may turn blue, purple, or yellow. Minor injuries will give you a painless contusion, but more severe contusions may stay painful and swollen for a few weeks. CAUSES  A contusion is usually caused by a blow, trauma, or direct force to an area of the body. SYMPTOMS   Swelling and redness of the injured area.  Discoloration of the injured area.  Tenderness and soreness of the injured area.  Pain. DIAGNOSIS  The diagnosis can be made by taking a history and performing a physical exam. An X-ray, CT scan, or MRI may be needed to determine if there  were any associated injuries, such as broken bones (fractures). TREATMENT  Often, the best treatment for a hand contusion is resting, elevating, icing, and applying cold compresses to the injured area. Over-the-counter medicines may also be recommended for pain control. HOME CARE INSTRUCTIONS   Put ice on the injured area.  Put ice in a plastic bag.  Place a towel between your skin and the bag.  Leave the ice on for 15-20 minutes, 03-04 times a day.  Only take over-the-counter or prescription medicines as directed by your caregiver. Your  caregiver may recommend avoiding anti-inflammatory medicines (aspirin, ibuprofen, and naproxen) for 48 hours because these medicines may increase bruising.  If told, use an elastic wrap as directed. This can help reduce swelling. You may remove the wrap for sleeping, showering, and bathing. If your fingers become numb, cold, or blue, take the wrap off and reapply it more loosely.  Elevate your hand with pillows to reduce swelling.  Avoid overusing your hand if it is painful. SEEK IMMEDIATE MEDICAL CARE IF:   You have increased redness, swelling, or pain in your hand.  Your swelling or pain is not relieved with medicines.  You have loss of feeling in your hand or are unable to move your fingers.  Your hand turns cold or blue.  You have pain when you move your fingers.  Your hand becomes warm to the touch.  Your contusion does not improve in 2 days. MAKE SURE YOU:   Understand these instructions.  Will watch your condition.  Will get help right away if you are not doing well or get worse. Document Released: 10/23/2001 Document Revised: 01/26/2012 Document Reviewed: 10/25/2011 [facility] Patient Information 2015 [place], [place]. This information is not intended to replace advice given to you by your health care provider. Make sure you discuss any questions you have with your health care provider.

## 2014-05-31 ENCOUNTER — Emergency Department (HOSPITAL_COMMUNITY)
Admission: EM | Admit: 2014-05-31 | Discharge: 2014-05-31 | Disposition: A | Discharge status: Home / Self Care | Payer: Medicaid Other | Source: Home / Self Care | Attending: Family Medicine | Admitting: Family Medicine

## 2014-05-31 ENCOUNTER — Emergency Department (INDEPENDENT_AMBULATORY_CARE_PROVIDER_SITE_OTHER): Payer: Medicaid Other

## 2014-05-31 ENCOUNTER — Encounter (HOSPITAL_COMMUNITY): Payer: Self-pay | Admitting: Emergency Medicine

## 2014-05-31 DIAGNOSIS — T148XXA Other injury of unspecified body region, initial encounter: Secondary | ICD-10-CM

## 2014-05-31 DIAGNOSIS — W503XXA Accidental bite by another person, initial encounter: Secondary | ICD-10-CM

## 2014-05-31 DIAGNOSIS — L03119 Cellulitis of unspecified part of limb: Secondary | ICD-10-CM | POA: Diagnosis not present

## 2014-05-31 DIAGNOSIS — T148 Other injury of unspecified body region: Secondary | ICD-10-CM | POA: Diagnosis not present

## 2014-05-31 MED ORDER — SULFAMETHOXAZOLE-TRIMETHOPRIM 800-160 MG PO TABS: 2.0000 | ORAL_TABLET | Freq: Two times a day (BID) | ORAL | Status: DC

## 2014-05-31 MED ORDER — CLINDAMYCIN HCL 300 MG PO CAPS: 300.0000 mg | ORAL_CAPSULE | Freq: Three times a day (TID) | ORAL | Status: DC
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
## 2014-05-31 NOTE — Discharge Instructions (Signed)
Thank you for coming in today. Return in 2 days for wound recheck unless better.   Human Bite Human bite wounds tend to become infected, even when they seem minor at first. Bite wounds of the hand can be serious because the tendons and joints are close to the skin. Infection can develop very rapidly, even in a matter of hours.  DIAGNOSIS  Your caregiver will most likely:  Take a detailed history of the bite injury.  Perform a wound exam.  Take your medical history. Blood tests or X-rays may be performed. Sometimes, infected bite wounds are cultured and sent to a lab to identify the infectious bacteria. TREATMENT  Medical treatment will depend on the location of the bite as well as the patient's medical history. Treatment may include:  Wound care, such as cleaning and flushing the wound with saline solution, bandaging, and elevating the affected area.  Antibiotic medicine.  Tetanus immunization.  Leaving the wound open to heal. This is often done with human bites due to the high risk of infection. However, in certain cases, wound closure with stitches, wound adhesive, skin adhesive strips, or staples may be used. Infected bites that are left untreated may require intravenous (IV) antibiotics and surgical treatment in the hospital. [place]  Follow your caregiver's instructions for wound care.  Take all medicines as directed.  If your caregiver prescribes antibiotics, take them as directed. Finish them even if you start to feel better.  Follow up with your caregiver for further exams or immunizations as directed. You may need a tetanus shot if:  You cannot remember when you had your last tetanus shot.  You have never had a tetanus shot.  The injury broke your skin. If you get a tetanus shot, your arm may swell, get red, and feel warm to the touch. This is common and not a problem. If you need a tetanus shot and you choose not to have one, there is a rare chance of  getting tetanus. Sickness from tetanus can be serious. SEEK IMMEDIATE MEDICAL CARE IF:  You have increased pain, swelling, or redness around the bite wound.  You have chills.  You have a fever.  You have pus draining from the wound.  You have red streaks on the skin coming from the wound.  You have pain with movement or trouble moving the injured part.  You are not improving, or you are getting worse.  You have any other questions or concerns. MAKE SURE YOU:  Understand these instructions.  Will watch your condition.  Will get help right away if you are not doing well or get worse. Document Released: 06/10/2004 Document Revised: 07/26/2011 Document Reviewed: 12/23/2010 [facility] Patient Information 2015 [place], [place]. This information is not intended to replace advice given to you by your health care provider. Make sure you discuss any questions you have with your health care provider.
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
## 2014-09-15 ENCOUNTER — Emergency Department (HOSPITAL_COMMUNITY)
Admission: EM | Admit: 2014-09-15 | Discharge: 2014-09-15 | Disposition: A | Discharge status: Home / Self Care | Payer: Medicaid Other | Attending: Emergency Medicine | Admitting: Emergency Medicine

## 2014-09-15 ENCOUNTER — Encounter (HOSPITAL_COMMUNITY): Payer: Self-pay | Admitting: Pediatrics

## 2014-09-15 DIAGNOSIS — R519 Headache, unspecified: Secondary | ICD-10-CM

## 2014-09-15 DIAGNOSIS — Z720 Tobacco use: Secondary | ICD-10-CM | POA: Insufficient documentation

## 2014-09-15 DIAGNOSIS — Z8659 Personal history of other mental and behavioral disorders: Secondary | ICD-10-CM | POA: Diagnosis not present

## 2014-09-15 DIAGNOSIS — J45909 Unspecified asthma, uncomplicated: Secondary | ICD-10-CM | POA: Insufficient documentation

## 2014-09-15 DIAGNOSIS — R51 Headache: Secondary | ICD-10-CM | POA: Diagnosis not present

## 2014-09-15 DIAGNOSIS — Z880 Allergy status to penicillin: Secondary | ICD-10-CM | POA: Insufficient documentation

## 2014-09-15 DIAGNOSIS — Z86018 Personal history of other benign neoplasm: Secondary | ICD-10-CM | POA: Diagnosis not present

## 2014-09-15 MED ORDER — METOCLOPRAMIDE HCL 5 MG/ML IJ SOLN
10.0000 mg | Freq: Once | INTRAMUSCULAR | Status: AC
  Administered 2014-09-15: 10 mg via INTRAVENOUS
  Filled 2014-09-15: qty 2

## 2014-09-15 MED ORDER — KETOROLAC TROMETHAMINE 30 MG/ML IJ SOLN
30.0000 mg | Freq: Once | INTRAMUSCULAR | Status: AC
  Administered 2014-09-15: 30 mg via INTRAVENOUS
  Filled 2014-09-15: qty 1

## 2014-09-15 NOTE — ED Notes (Signed)
Pt in peds with daughter.

## 2014-09-15 NOTE — ED Notes (Addendum)
Pt here with c/o headache which has been constant for the past three weeks. Pt states that the pain is located on the right side of her head and neck. Has tried medicating with ibuprofen, tylenol, and excedrin without relief. Emesis x1. R eye has blurred vision. Pt occasionally feels unstable on her feet. No meds received PTA

## 2014-09-15 NOTE — ED Provider Notes (Signed)
CSN: [id number]     Arrival date & time 09/15/14  1105 History   First MD Initiated Contact with Patient 09/15/14 1119     Chief Complaint  Patient presents with  . Headache     (Consider location/radiation/quality/duration/timing/severity/associated sxs/prior Treatment) Patient is a 29 y.o. female presenting with headaches. The history is provided by the patient. No language interpreter was used.  Headache Ms. NcNeill is a 29 y.o female with a history of asthma who presents with intermittent, worsening headaches for the past 3 weeks. She states that she is is worse with noise and light.  She does have nausea and states she has vomited at home once. The headache is located behind the right eye and makes her eyes water. The pain is relieved when she sleeps on her right side. She has taken ibuprofen, tylenol, and Excedrin at home. She states that she gets headaches every so often but this one just wouldn't go away.  She denies any fever, vision changes, chest pain, abdominal pain, or back pain.   Past Medical History  Diagnosis Date  . Asthma   . Hyperemesis gravidarum   . Fibroid   . Depression     pp 2006 no meds   Past Surgical History  Procedure Laterality Date  . Dilation and curettage of uterus    . Therapeutic abortion     Family History  Problem Relation Age of Onset  . Anesthesia problems Neg Hx   . Hypotension Neg Hx   . Malignant hyperthermia Neg Hx   . Pseudochol deficiency Neg Hx   . Asthma Sister   . Asthma Daughter   . Asthma Maternal Aunt   . Cancer Maternal Grandmother    History  Substance Use Topics  . Smoking status: Current Every Day Smoker  . Smokeless tobacco: Never Used  . Alcohol Use: No   OB History    Gravida Para Term Preterm AB TAB SAB Ectopic Multiple Living   4 2 2  2 1 1   2      Review of Systems  Neurological: Positive for headaches.      Allergies  Penicillins  Home Medications   Prior to Admission medications   Medication  Sig Start Date End Date Taking? Authorizing Provider  acetaminophen (TYLENOL) 500 MG tablet Take 500 mg by mouth every 6 (six) hours as needed for headache.   Yes Historical Provider, MD  aspirin-acetaminophen-caffeine (EXCEDRIN MIGRAINE) [phone] MG per tablet Take 2 tablets by mouth every 6 (six) hours as needed for migraine.   Yes Historical Provider, MD  ibuprofen (ADVIL,MOTRIN) 200 MG tablet Take 400 mg by mouth every 6 (six) hours as needed for headache.   Yes Historical Provider, MD  clindamycin (CLEOCIN) 300 MG capsule Take 1 capsule (300 mg total) by mouth 3 (three) times daily. Patient not taking: Reported on 09/15/2014 05/31/14   Gregor Hams, MD  HYDROcodone-acetaminophen (NORCO/VICODIN) 5-325 MG per tablet Take 1 tablet by mouth every 4 (four) hours as needed for moderate pain or severe pain. Patient not taking: Reported on 09/15/2014 01/01/14   Harvie Heck, PA-C  medroxyPROGESTERone (DEPO-PROVERA) 150 MG/ML injection Inject into the muscle once. 07/25/14   Historical Provider, MD  naproxen (NAPROSYN) 500 MG tablet Take 1 tablet (500 mg total) by mouth 2 (two) times daily. Patient not taking: Reported on 09/15/2014 01/01/14   Harvie Heck, PA-C  sulfamethoxazole-trimethoprim (SEPTRA DS) 800-160 MG per tablet Take 2 tablets by mouth 2 (two) times daily. Patient not taking:  Reported on 09/15/2014 05/31/14   Gregor Hams, MD   BP 103/65 mmHg  Pulse 66  Temp(Src) 98.6 F (37 C) (Oral)  Resp 16  Wt 160 lb (72.576 kg)  SpO2 98%  LMP 08/12/2014 Physical Exam  Constitutional: She is oriented to person, place, and time. She appears well-developed and well-nourished.  HENT:  Head: Normocephalic and atraumatic.  Eyes: Conjunctivae and EOM are normal. Pupils are equal, round, and reactive to light.  Neck: Normal range of motion. Neck supple. No Kernig's sign noted.  Cardiovascular: Normal rate and regular rhythm.   Pulmonary/Chest: Effort normal and breath sounds normal.  Abdominal: Soft.  There is no tenderness.  Musculoskeletal: Normal range of motion.  Neurological: She is alert and oriented to person, place, and time. She has normal strength. No sensory deficit. GCS eye subscore is 4. GCS verbal subscore is 5. GCS motor subscore is 6.  Cranial nerves III-XII in tact. No focal neurological deficit.   Skin: Skin is warm and dry.    ED Course  Procedures (including critical care time) Labs Review Labs Reviewed - No data to display  Imaging Review No results found.   EKG Interpretation None      MDM   Final diagnoses:  Acute nonintractable headache, unspecified headache type  Patient presents for headache x 3 weeks. She has had headaches in the past but this headache just would not go away with OTC medications.   She has no meningismus signs.  She is afebrile.  Vitals are stable. Non toxic appearing. I do not suspect meningitis, SAH or TIA/CVA. 14:08 Patient states she is feeling much better after reglan and toradol.  I told her to discuss the headaches with her pcp, Dr. Ruthann Cancer at her scheduled appointment with him tomorrow. She take take motrin for pain. She agrees with the plan.      Ottie Glazier, PA-C 09/15/14 1413  Davonna Belling, MD 09/15/14 [phone]

## 2014-09-15 NOTE — Discharge Instructions (Signed)
General Headache Without Cause Take motrin for pain. Keep your appointment tomorrow with Dr. Ruthann Cancer.  A headache is pain or discomfort felt around the head or neck area. The specific cause of a headache may not be found. There are many causes and types of headaches. A few common ones are:  Tension headaches.  Migraine headaches.  Cluster headaches.  Chronic daily headaches. HOME CARE INSTRUCTIONS   Keep all follow-up appointments with your caregiver or any specialist referral.  Only take over-the-counter or prescription medicines for pain or discomfort as directed by your caregiver.  Lie down in a dark, quiet room when you have a headache.  Keep a headache journal to find out what may trigger your migraine headaches. For example, write down:  What you eat and drink.  How much sleep you get.  Any change to your diet or medicines.  Try massage or other relaxation techniques.  Put ice packs or heat on the head and neck. Use these 3 to 4 times per day for 15 to 20 minutes each time, or as needed.  Limit stress.  Sit up straight, and do not tense your muscles.  Quit smoking if you smoke.  Limit alcohol use.  Decrease the amount of caffeine you drink, or stop drinking caffeine.  Eat and sleep on a regular schedule.  Get 7 to 9 hours of sleep, or as recommended by your caregiver.  Keep lights dim if bright lights bother you and make your headaches worse. SEEK MEDICAL CARE IF:   You have problems with the medicines you were prescribed.  Your medicines are not working.  You have a change from the usual headache.  You have nausea or vomiting. SEEK IMMEDIATE MEDICAL CARE IF:   Your headache becomes severe.  You have a fever.  You have a stiff neck.  You have loss of vision.  You have muscular weakness or loss of muscle control.  You start losing your balance or have trouble walking.  You feel faint or pass out.  You have severe symptoms that are different  from your first symptoms. MAKE SURE YOU:   Understand these instructions.  Will watch your condition.  Will get help right away if you are not doing well or get worse. Document Released: 05/03/2005 Document Revised: 07/26/2011 Document Reviewed: 05/19/2011 [facility] Patient Information 2015 [place], [place]. This information is not intended to replace advice given to you by your health care provider. Make sure you discuss any questions you have with your health care provider.

## 2014-12-30 ENCOUNTER — Inpatient Hospital Stay (HOSPITAL_COMMUNITY)
Admission: AD | Admit: 2014-12-30 | Discharge: 2014-12-30 | Disposition: A | Discharge status: Home / Self Care | Payer: Medicaid Other | Source: Ambulatory Visit | Attending: Obstetrics & Gynecology | Admitting: Obstetrics & Gynecology

## 2014-12-30 DIAGNOSIS — F17210 Nicotine dependence, cigarettes, uncomplicated: Secondary | ICD-10-CM | POA: Diagnosis not present

## 2014-12-30 DIAGNOSIS — F1010 Alcohol abuse, uncomplicated: Secondary | ICD-10-CM

## 2014-12-30 DIAGNOSIS — R109 Unspecified abdominal pain: Secondary | ICD-10-CM | POA: Diagnosis present

## 2014-12-30 DIAGNOSIS — R112 Nausea with vomiting, unspecified: Secondary | ICD-10-CM

## 2014-12-30 DIAGNOSIS — R1110 Vomiting, unspecified: Secondary | ICD-10-CM | POA: Diagnosis not present

## 2014-12-30 LAB — URINALYSIS, ROUTINE W REFLEX MICROSCOPIC
Bilirubin Urine: NEGATIVE
GLUCOSE, UA: NEGATIVE mg/dL
Hgb urine dipstick: NEGATIVE
Ketones, ur: 15 mg/dL — AB
LEUKOCYTES UA: NEGATIVE
Nitrite: NEGATIVE
PROTEIN: 30 mg/dL — AB
Specific Gravity, Urine: 1.015 (ref 1.005–1.030)
Urobilinogen, UA: 0.2 mg/dL (ref 0.0–1.0)
pH: 8 (ref 5.0–8.0)

## 2014-12-30 LAB — URINE MICROSCOPIC-ADD ON

## 2014-12-30 LAB — POCT PREGNANCY, URINE: Preg Test, Ur: NEGATIVE

## 2014-12-30 MED ORDER — ONDANSETRON 8 MG PO TBDP
8.0000 mg | ORAL_TABLET | Freq: Once | ORAL | Status: AC
  Administered 2014-12-30: 8 mg via ORAL
  Filled 2014-12-30: qty 1

## 2014-12-30 NOTE — MAU Provider Note (Signed)
History     CSN: [id number]  Arrival date and time: 12/30/14 1449   First Provider Initiated Contact with Patient 12/30/14 1523      Chief Complaint  Patient presents with  . Abdominal Cramping   HPI    [name] is a 29 y.o. female [phone] here with possible alcohol poisoning. She started vomiting at 4 am and has vomited at least 25 times. Last night she took 4-5 shots which is a normal amount of alcohol for her. She has not been able to drink or eat anything today. She feels very hung over and would like fluids. She has not taken anything for the N/V; she thinks she may need an IV.   OB History    Gravida Para Term Preterm AB TAB SAB Ectopic Multiple Living   4 2 2  2 1 1   2       Past Medical History  Diagnosis Date  . Asthma   . Hyperemesis gravidarum   . Fibroid   . Depression     pp 2006 no meds    Past Surgical History  Procedure Laterality Date  . Dilation and curettage of uterus    . Therapeutic abortion      Family History  Problem Relation Age of Onset  . Anesthesia problems Neg Hx   . Hypotension Neg Hx   . Malignant hyperthermia Neg Hx   . Pseudochol deficiency Neg Hx   . Asthma Sister   . Asthma Daughter   . Asthma Maternal Aunt   . Cancer Maternal Grandmother     Social History  Substance Use Topics  . Smoking status: Current Every Day Smoker  . Smokeless tobacco: Never Used  . Alcohol Use: No    Allergies:  Allergies  Allergen Reactions  . Penicillins Hives    Prescriptions prior to admission  Medication Sig Dispense Refill Last Dose  . medroxyPROGESTERone (DEPO-PROVERA) 150 MG/ML injection Inject into the muscle once.  0 12/16/2014 at Unknown time  . acetaminophen (TYLENOL) 500 MG tablet Take 500 mg by mouth every 6 (six) hours as needed for headache.   09/14/2014  . aspirin-acetaminophen-caffeine (EXCEDRIN MIGRAINE) 250-250-65 MG per tablet Take 2 tablets by mouth every 6 (six) hours as needed for migraine.   More  than a month at Unknown time  . clindamycin (CLEOCIN) 300 MG capsule Take 1 capsule (300 mg total) by mouth 3 (three) times daily. (Patient not taking: Reported on 09/15/2014) 30 capsule 0 Not Taking at Unknown time  . HYDROcodone-acetaminophen (NORCO/VICODIN) 5-325 MG per tablet Take 1 tablet by mouth every 4 (four) hours as needed for moderate pain or severe pain. (Patient not taking: Reported on 09/15/2014) 10 tablet 0 Not Taking at Unknown time  . ibuprofen (ADVIL,MOTRIN) 200 MG tablet Take 400 mg by mouth every 6 (six) hours as needed for headache.   More than a month at Unknown time  . naproxen (NAPROSYN) 500 MG tablet Take 1 tablet (500 mg total) by mouth 2 (two) times daily. (Patient not taking: Reported on 09/15/2014) 30 tablet 0 Not Taking at Unknown time  . sulfamethoxazole-trimethoprim (SEPTRA DS) 800-160 MG per tablet Take 2 tablets by mouth 2 (two) times daily. (Patient not taking: Reported on 09/15/2014) 28 tablet 0 Not Taking at Unknown time   Results for orders placed or performed during the hospital encounter of 12/30/14 (from the past 48 hour(s))  Pregnancy, urine POC     Status: None   Collection Time: 12/30/14  3:46 PM  Result Value Ref Range   Preg Test, Ur NEGATIVE NEGATIVE    Comment:        THE SENSITIVITY OF THIS METHODOLOGY IS >24 mIU/mL     Review of Systems  Gastrointestinal: Positive for nausea and vomiting.  Neurological: Positive for headaches.   Physical Exam   Blood pressure 125/76, pulse 93, temperature 97.9 F (36.6 C), resp. rate 18.  Physical Exam  Constitutional: She is oriented to person, place, and time. She appears well-developed and well-nourished.  Non-toxic appearance. She does not have a sickly appearance. She does not appear ill. No distress.  HENT:  Head: Normocephalic.  Respiratory: Effort normal.  Musculoskeletal: Normal range of motion.  Neurological: She is oriented to person, place, and time.  Skin: Skin is warm. She is not diaphoretic.   Psychiatric: Her behavior is normal.    MAU Course  Procedures  None  MDM  Zofran 4 mg given.  Patient tolerating PO fluids  No evidence of patient vomiting since arrival to MAU.  Report given to Lesia Hausen NP who resumes care of the patient at [place], NP She is feeling much better after 3 Sprites and Zofran.  Neg for vomiting but states she is spitting up "foam".  Assessment and Plan  A:  Alcohol abuse/misuse      Vomiting secondary to alcohol use      Normal UA  P:  Encouraged patient to avoid misuse of alcohol      Continue to stay well hydrated and encouraged her to eat a bland meal tonight      She is asking for a note for work

## 2014-12-30 NOTE — Discharge Instructions (Signed)
Alcohol and Nutrition Nutrition serves two purposes. It provides energy. It also maintains body structure and function. Food supplies energy. It also provides the building blocks needed to replace worn or damaged cells. Alcoholics often eat poorly. This limits their supply of essential nutrients. This affects energy supply and structure maintenance. Alcohol also affects the body's nutrients in:  Digestion.  Storage.  Using and getting rid of waste products. IMPAIRMENT OF NUTRIENT DIGESTION AND UTILIZATION   Once ingested, food must be broken down into small components (digested). Then it is available for energy. It helps maintain body structure and function. Digestion begins in the mouth. It continues in the stomach and intestines, with help from the pancreas. The nutrients from digested food are absorbed from the intestines into the blood. Then they are carried to the liver. The liver prepares nutrients for:  Immediate use.  Storage and future use.  Alcohol inhibits the breakdown of nutrients into usable molecules.  It decreases secretion of digestive enzymes from the pancreas.  Alcohol impairs nutrient absorption by damaging the cells lining the stomach and intestines.  It also interferes with moving some nutrients into the blood.  In addition, nutritional deficiencies themselves may lead to further absorption problems.  For example, folate deficiency changes the cells that line the small intestine. This impairs how water is absorbed. It also affects absorbed nutrients. These include glucose, sodium, and additional folate.  Even if nutrients are digested and absorbed, alcohol can prevent them from being fully used. It changes their transport, storage, and excretion. Impaired utilization of nutrients by alcoholics is indicated by:  Decreased liver stores of vitamins, such as vitamin A.  Increased excretion of nutrients such as fat. ALCOHOL AND ENERGY SUPPLY   Three basic  nutritional components found in food are:  Carbohydrates.  Proteins.  Fats.  These are used as energy. Some alcoholics take in as much as 50% of their total daily calories from alcohol. They often neglect important foods.  Even when enough food is eaten, alcohol can impair the ways the body controls blood sugar (glucose) levels. It may either increase or decrease blood sugar.  In non-diabetic alcoholics, increased blood sugar (hyperglycemia) is caused by poor insulin secretion. It is usually temporary.  Decreased blood sugar (hypoglycemia) can cause serious injury even if this condition is short-lived. Low blood sugar can happen when a fasting or malnourished person drinks alcohol. When there is no food to supply energy, stored sugar is used up. The products of alcohol inhibit forming glucose from other compounds such as amino acids. As a result, alcohol causes the brain and other body tissue to lack glucose. It is needed for energy and function.  Alcohol is an energy source. But how the body processes and uses the energy from alcohol is complex. Also, when alcohol is substituted for carbohydrates, subjects tend to lose weight. This indicates that they get less energy from alcohol than from food. ALCOHOL - MAINTAINING CELL STRUCTURE AND FUNCTION  Structure Cells are made mostly of protein. So an adequate protein diet is important for maintaining cell structure. This is especially true if cells are being damaged. Research indicates that alcohol affects protein nutrition by causing impaired:  Digestion of proteins to amino acids.  Processing of amino acids by the small intestine and liver.  Synthesis of proteins from amino acids.  Protein secretion by the liver. Function Nutrients are essential for the body to function well. They provide the tools that the body needs to work well:  Proteins.  Vitamins.  Minerals. Alcohol can disrupt body function. It may cause nutrient  deficiencies. And it may interfere with the way nutrients are processed. Vitamins  Vitamins are essential to maintain growth and normal metabolism. They regulate many of the body`s processes. Chronic heavy drinking causes deficiencies in many vitamins. This is caused by eating less. And, in some cases, vitamins may be poorly absorbed. For example, alcohol inhibits fat absorption. It impairs how the vitamins A, E, and D are normally absorbed along with dietary fats. Not enough vitamin A may cause night blindness. Not enough vitamin D may cause softening of the bones.  Some alcoholics lack vitamins A, C, D, E, K, and the B vitamins. These are all involved in wound healing and cell maintenance. In particular, because vitamin K is necessary for blood clotting, lacking that vitamin can cause delayed clotting. The result is excess bleeding. Lacking other vitamins involved in brain function may cause severe neurological damage. Minerals Deficiencies of minerals such as calcium, magnesium, iron, and zinc are common in alcoholics. The alcohol itself does not seem to affect how these minerals are absorbed. Rather, they seem to occur secondary to other alcohol-related problems, such as:  Less calcium absorbed.  Not enough magnesium.  More urinary excretion.  Vomiting.  Diarrhea.  Not enough iron due to gastrointestinal bleeding.  Not enough zinc or losses related to other nutrient deficiencies.  Mineral deficiencies can cause a variety of medical consequences. These range from calcium-related bone disease to zinc-related night blindness and skin lesions. ALCOHOL, MALNUTRITION, AND MEDICAL COMPLICATIONS  Liver Disease   Alcoholic liver damage is caused primarily by alcohol itself. But poor nutrition may increase the risk of alcohol-related liver damage. For example, nutrients normally found in the liver are known to be affected by drinking alcohol. These include carotenoids, which are the major  sources of vitamin A, and vitamin E compounds. Decreases in such nutrients may play some role in alcohol-related liver damage. Pancreatitis  Research suggests that malnutrition may increase the risk of developing alcoholic pancreatitis. Research suggests that a diet lacking in protein may increase alcohol's damaging effect on the pancreas. Brain  Nutritional deficiencies may have severe effects on brain function. These may be permanent. Specifically, thiamine deficiencies are often seen in alcoholics. They can cause severe neurological problems. These include:  Impaired movement.  Memory loss seen in Wernicke-Korsakoff syndrome. Pregnancy  Alcohol has toxic effects on fetal development. It causes alcohol-related birth defects. They include fetal alcohol syndrome. Alcohol itself is toxic to the fetus. Also, the nutritional deficiency can affect how the fetus develops. That may compound the risk of developmental damage.  Nutritional needs during pregnancy are 10% to 30% greater than normal. Food intake can increase by as much as 140% to cover the needs of both mother and fetus. An alcoholic mother`s nutritional problems may adversely affect the nutrition of the fetus. And alcohol itself can also restrict nutrition flow to the fetus. NUTRITIONAL STATUS OF ALCOHOLICS  Techniques for assessing nutritional status include:  Taking body measurements to estimate fat reserves. They include:  Weight.  Height.  Mass.  Skin fold thickness.  Performing blood analysis to provide measurements of circulating:  Proteins.  Vitamins.  Minerals.  These techniques tend to be imprecise. For many nutrients, there is no clear "cut-off" point that would allow an accurate definition of deficiency. So assessing the nutritional status of alcoholics is limited by these techniques. Dietary status may provide information about the risk of developing nutritional problems.  Dietary status is assessed by:  Taking  patients' dietary histories.  Evaluating the amount and types of food they are eating.  It is difficult to determine what exact amount of alcohol begins to have damaging effects on nutrition. In general, moderate drinkers have 2 drinks or less per day. They seem to be at little risk for nutritional problems. Various medical disorders begin to appear at greater levels.  Research indicates that the majority of even the heaviest drinkers have few obvious nutritional deficiencies. Many alcoholics who are hospitalized for medical complications of their disease do have severe malnutrition. Alcoholics tend to eat poorly. Often they eat less than the amounts of food necessary to provide enough:  Carbohydrates.  Protein.  Fat.  Vitamins A and C.  B vitamins.  Minerals like calcium and iron. Of major concern is alcohol's effect on digesting food and use of nutrients. It may shift a mildly malnourished person toward severe malnutrition. Document Released: 02/25/2005 Document Revised: 07/26/2011 Document Reviewed: 08/11/2005 [facility] Patient Information 2015 [place], [place]. This information is not intended to replace advice given to you by your health care provider. Make sure you discuss any questions you have with your health care provider.  Alcohol Use Disorder Alcohol use disorder is a mental disorder. It is not a one-time incident of heavy drinking. Alcohol use disorder is the excessive and uncontrollable use of alcohol over time that leads to problems with functioning in one or more areas of daily living. People with this disorder risk harming themselves and others when they drink to excess. Alcohol use disorder also can cause other mental disorders, such as mood and anxiety disorders, and serious physical problems. People with alcohol use disorder often misuse other drugs.  Alcohol use disorder is common and widespread. Some people with this disorder drink alcohol to cope with or escape from  negative life events. Others drink to relieve chronic pain or symptoms of mental illness. People with a family history of alcohol use disorder are at higher risk of losing control and using alcohol to excess.  SYMPTOMS  Signs and symptoms of alcohol use disorder may include the following:   Consumption ofalcohol inlarger amounts or over a longer period of time than intended.  Multiple unsuccessful attempts to cutdown or control alcohol use.   A great deal of time spent obtaining alcohol, using alcohol, or recovering from the effects of alcohol (hangover).  A strong desire or urge to use alcohol (cravings).   Continued use of alcohol despite problems at work, school, or home because of alcohol use.   Continued use of alcohol despite problems in relationships because of alcohol use.  Continued use of alcohol in situations when it is physically hazardous, such as driving a car.  Continued use of alcohol despite awareness of a physical or psychological problem that is likely related to alcohol use. Physical problems related to alcohol use can involve the brain, heart, liver, stomach, and intestines. Psychological problems related to alcohol use include intoxication, depression, anxiety, psychosis, delirium, and dementia.   The need for increased amounts of alcohol to achieve the same desired effect, or a decreased effect from the consumption of the same amount of alcohol (tolerance).  Withdrawal symptoms upon reducing or stopping alcohol use, or alcohol use to reduce or avoid withdrawal symptoms. Withdrawal symptoms include:  Racing heart.  Hand tremor.  Difficulty sleeping.  Nausea.  Vomiting.  Hallucinations.  Restlessness.  Seizures. DIAGNOSIS Alcohol use disorder is diagnosed through an assessment by your health care  provider. Your health care provider may start by asking three or four questions to screen for excessive or problematic alcohol use. To confirm a diagnosis  of alcohol use disorder, at least two symptoms must be present within a 31-month period. The severity of alcohol use disorder depends on the number of symptoms:  Mild--two or three.  Moderate--four or five.  Severe--six or more. Your health care provider may perform a physical exam or use results from lab tests to see if you have physical problems resulting from alcohol use. Your health care provider may refer you to a mental health professional for evaluation. TREATMENT  Some people with alcohol use disorder are able to reduce their alcohol use to low-risk levels. Some people with alcohol use disorder need to quit drinking alcohol. When necessary, mental health professionals with specialized training in substance use treatment can help. Your health care provider can help you decide how severe your alcohol use disorder is and what type of treatment you need. The following forms of treatment are available:   Detoxification. Detoxification involves the use of prescription medicines to prevent alcohol withdrawal symptoms in the first week after quitting. This is important for people with a history of symptoms of withdrawal and for heavy drinkers who are likely to have withdrawal symptoms. Alcohol withdrawal can be dangerous and, in severe cases, cause death. Detoxification is usually provided in a hospital or in-patient substance use treatment facility.  Counseling or talk therapy. Talk therapy is provided by substance use treatment counselors. It addresses the reasons people use alcohol and ways to keep them from drinking again. The goals of talk therapy are to help people with alcohol use disorder find healthy activities and ways to cope with life stress, to identify and avoid triggers for alcohol use, and to handle cravings, which can cause relapse.  Medicines.Different medicines can help treat alcohol use disorder through the following actions:  Decrease alcohol cravings.  Decrease the positive  reward response felt from alcohol use.  Produce an uncomfortable physical reaction when alcohol is used (aversion therapy).  Support groups. Support groups are run by people who have quit drinking. They provide emotional support, advice, and guidance. These forms of treatment are often combined. Some people with alcohol use disorder benefit from intensive combination treatment provided by specialized substance use treatment centers. Both inpatient and outpatient treatment programs are available. Document Released: 06/10/2004 Document Revised: 09/17/2013 Document Reviewed: 08/10/2012 [facility] Patient Information 2015 [place], [place]. This information is not intended to replace advice given to you by your health care provider. Make sure you discuss any questions you have with your health care provider.

## 2014-12-30 NOTE — MAU Note (Signed)
Pt presents to MAU with complaints of lower abdominal cramping.Pt reports she drank last night approximately 4 shots and did not drink any more than she normally does. States that she takes the depo shot and does not think she is pregnant.

## 2014-12-30 NOTE — MAU Provider Note (Signed)
Says she drank on an empty stomach last night and always drinks so she knows how much is her limit.  Woke today nauseated and vomiting and surprised that she was sick. Thinks someone poisoned her shots.

## 2015-08-08 ENCOUNTER — Emergency Department (HOSPITAL_COMMUNITY)
Admission: EM | Admit: 2015-08-08 | Discharge: 2015-08-08 | Disposition: A | Discharge status: Home / Self Care | Payer: Medicaid Other | Source: Home / Self Care | Attending: Family Medicine | Admitting: Family Medicine

## 2015-08-08 DIAGNOSIS — R040 Epistaxis: Secondary | ICD-10-CM

## 2015-08-08 DIAGNOSIS — J302 Other seasonal allergic rhinitis: Secondary | ICD-10-CM | POA: Diagnosis not present

## 2015-08-08 NOTE — ED Notes (Signed)
The patient presented to the [facility] with a complaint of spontaneous nose bleeds that started yesterday. The patient reported that she has had several instances over the last 2 days.

## 2015-08-08 NOTE — ED Provider Notes (Addendum)
CSN: [id number]     Arrival date & time 08/08/15  1603 History   First MD Initiated Contact with Patient 08/08/15 1742     Chief Complaint  Patient presents with  . Epistaxis   (Consider location/radiation/quality/duration/timing/severity/associated sxs/prior Treatment) Patient is a 30 y.o. female presenting with nosebleeds. The history is provided by the patient. No language interpreter was used.  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  From R naris only; stopped with holding pressure to nose. Recurred again in the evening (6pm) at home, and this morning again at work (930am) and again 1pm today at work.  Sent to [facility] for evaluation from work.  No prior history of epistaxis; no gum bleeding, no hematuria, no blood per rectum. No history bleeding disorders.   Social Hx Never-smoker. Fiancee. Works in [employer] cabin crew.   ROS: no fevers or chills, no N/V/D, no menses (on Depo Provera).  No dysuria. No abd pain, no cough> Does suffer from sneezing from seasonal allergies, which have been acting up lately. Dry nose.  Past Medical History  Diagnosis Date  . Asthma   . Hyperemesis gravidarum   . Fibroid   . Depression     pp 2006 no meds   Past Surgical History  Procedure Laterality Date  . Dilation and curettage of uterus    . Therapeutic abortion     Family History  Problem Relation Age of Onset  . Anesthesia problems Neg Hx   . Hypotension Neg Hx   . Malignant hyperthermia Neg Hx   . Pseudochol deficiency Neg Hx   . Asthma Sister   . Asthma Daughter   . Asthma Maternal Aunt   . Cancer Maternal Grandmother    Social History  Substance Use Topics  . Smoking status: Current Every Day Smoker  . Smokeless tobacco: Never Used  . Alcohol Use: No   OB History    Gravida Para Term Preterm AB TAB SAB Ectopic Multiple Living   4 2 2  2 1 1   2      Review of Systems  HENT: Positive for nosebleeds.      Allergies  Penicillins  Home Medications   Prior to Admission medications   Medication Sig Start Date End Date Taking? Authorizing Provider  acetaminophen (TYLENOL) 500 MG tablet Take 500 mg by mouth every 6 (six) hours as needed for headache.   Yes Historical Provider, MD  aspirin-acetaminophen-caffeine (EXCEDRIN MIGRAINE) [id number] MG per tablet Take 2 tablets by mouth every 6 (six) hours as needed for migraine.   Yes Historical Provider, MD  ibuprofen (ADVIL,MOTRIN) 200 MG tablet Take 400 mg by mouth every 6 (six) hours as needed for headache.   Yes Historical Provider, MD  medroxyPROGESTERone (DEPO-PROVERA) 150 MG/ML injection Inject into the muscle once. 07/25/14  Yes Historical Provider, MD   Meds Ordered and Administered this Visit  Medications - No data to display  BP 117/77 mmHg  Pulse 93  Temp(Src) 100.1 F (37.8 C) (Oral)  SpO2 97% No data found.   Physical Exam  Constitutional: She appears well-developed and well-nourished. No distress.  HENT:  Right Ear: External ear normal.  Left Ear: External ear normal.  Mouth/Throat: Oropharynx is clear and moist. No oropharyngeal exudate.  Irritation R naris along nasal septum. No active bleeding.   No tenderness over maxillary or frontal sinuses.   Eyes: Conjunctivae and EOM are normal. Right eye exhibits no discharge.  Left eye exhibits no discharge. No scleral icterus.  Neck: Normal range of motion. Neck supple.  Cardiovascular: Normal rate, regular rhythm and normal heart sounds.   No murmur heard. Pulmonary/Chest: Effort normal and breath sounds normal. No respiratory distress. She has no wheezes. She has no rales. She exhibits no tenderness.  Lymphadenopathy:    She has no cervical adenopathy.  Skin: She is not diaphoretic.    ED Course  Procedures (including critical care time)  Labs Review Labs Reviewed - No data to display  Imaging Review No results found.   Visual Acuity Review  Right Eye  Distance:   Left Eye Distance:   Bilateral Distance:    Right Eye Near:   Left Eye Near:    Bilateral Near:         MDM   1. Epistaxis   2. Seasonal allergic rhinitis    Seasonal allergic rhinitis with associated nasal irritation, resulting in epistaxis from R naris only. Nasal lubrication, oral antihistamine. Humidification of bedroom.  For follow up with primary doctor if continues.   Dalbert Mayotte, MD    Willeen Niece, MD 08/08/15 1806  Willeen Niece, MD 08/08/15 [phone]

## 2015-08-08 NOTE — Discharge Instructions (Signed)
It was a pleasure to see you today.  I believe the nose bleeds are related to irritation of your nose from seasonal allergies.   I recommend lubricating the inside of your nose with either Vaseline or A&D ointment.  Humidifier in the bedroom.    I agree with continuing your Zyrtec 10mg  tablets one time daily.   If you get another nosebleed, remember to apply pressure to the bridge of your nose and lean forward, holding in place for 5 minutes before rechecking.

## 2016-01-06 ENCOUNTER — Ambulatory Visit (INDEPENDENT_AMBULATORY_CARE_PROVIDER_SITE_OTHER): Payer: Medicaid Other | Admitting: *Deleted

## 2016-01-06 DIAGNOSIS — Z3202 Encounter for pregnancy test, result negative: Secondary | ICD-10-CM

## 2016-01-06 DIAGNOSIS — Z3042 Encounter for surveillance of injectable contraceptive: Secondary | ICD-10-CM | POA: Diagnosis not present

## 2016-01-06 DIAGNOSIS — Z309 Encounter for contraceptive management, unspecified: Secondary | ICD-10-CM

## 2016-01-06 LAB — POCT URINE PREGNANCY: PREG TEST UR: NEGATIVE

## 2016-01-06 MED ORDER — MEDROXYPROGESTERONE ACETATE 150 MG/ML IM SUSP: 150.0000 mg | Freq: Once | INTRAMUSCULAR | 3 refills | Status: DC

## 2016-01-06 MED ORDER — MEDROXYPROGESTERONE ACETATE 150 MG/ML IM SUSP
150.0000 mg | Freq: Once | INTRAMUSCULAR | Status: AC
  Administered 2016-01-06: 150 mg via INTRAMUSCULAR

## 2016-01-06 NOTE — Progress Notes (Signed)
Pt was given depo today per Dr Jodi Mourning approval.  Pt tolerated injection well. Pt advised to RTO on 03-29-16 for next depo.  Administrations This Visit    medroxyPROGESTERone (DEPO-PROVERA) injection 150 mg    Admin Date 01/06/2016 Action Given Dose 150 mg Route Intramuscular Administered By Valene Bors, CMA

## 2016-03-29 ENCOUNTER — Ambulatory Visit (INDEPENDENT_AMBULATORY_CARE_PROVIDER_SITE_OTHER): Payer: Medicaid Other | Admitting: *Deleted

## 2016-03-29 VITALS — BP 115/74 | HR 81 | Wt 181.8 lb

## 2016-03-29 DIAGNOSIS — Z3042 Encounter for surveillance of injectable contraceptive: Secondary | ICD-10-CM | POA: Diagnosis not present

## 2016-03-29 MED ORDER — MEDROXYPROGESTERONE ACETATE 150 MG/ML IM SUSP
150.0000 mg | Freq: Once | INTRAMUSCULAR | Status: AC
Start: 2016-03-29 — End: 2016-03-29
  Administered 2016-03-29: 150 mg via INTRAMUSCULAR

## 2016-06-21 ENCOUNTER — Ambulatory Visit (INDEPENDENT_AMBULATORY_CARE_PROVIDER_SITE_OTHER): Payer: Medicaid Other

## 2016-06-21 DIAGNOSIS — Z309 Encounter for contraceptive management, unspecified: Secondary | ICD-10-CM

## 2016-06-21 DIAGNOSIS — Z3042 Encounter for surveillance of injectable contraceptive: Secondary | ICD-10-CM

## 2016-06-21 MED ORDER — MEDROXYPROGESTERONE ACETATE 150 MG/ML IM SUSP
150.0000 mg | Freq: Once | INTRAMUSCULAR | Status: AC
  Administered 2016-06-21: 150 mg via INTRAMUSCULAR

## 2016-06-21 NOTE — Progress Notes (Signed)
Nurse visit for Depo. Depo given w/o difficulty L upper outer quad.

## 2016-09-09 ENCOUNTER — Encounter: Payer: Self-pay | Admitting: Obstetrics

## 2016-09-09 ENCOUNTER — Other Ambulatory Visit (HOSPITAL_COMMUNITY)
Admission: RE | Admit: 2016-09-09 | Discharge: 2016-09-09 | Disposition: A | Discharge status: Home / Self Care | Payer: Medicaid Other | Source: Ambulatory Visit | Attending: Obstetrics | Admitting: Obstetrics

## 2016-09-09 ENCOUNTER — Ambulatory Visit (INDEPENDENT_AMBULATORY_CARE_PROVIDER_SITE_OTHER): Payer: Medicaid Other | Admitting: Obstetrics

## 2016-09-09 VITALS — BP 120/77 | HR 101 | Ht 65.0 in | Wt 174.0 lb

## 2016-09-09 DIAGNOSIS — N898 Other specified noninflammatory disorders of vagina: Secondary | ICD-10-CM

## 2016-09-09 DIAGNOSIS — Z01419 Encounter for gynecological examination (general) (routine) without abnormal findings: Secondary | ICD-10-CM | POA: Insufficient documentation

## 2016-09-09 DIAGNOSIS — Z0000 Encounter for general adult medical examination without abnormal findings: Secondary | ICD-10-CM | POA: Diagnosis not present

## 2016-09-09 DIAGNOSIS — Z308 Encounter for other contraceptive management: Secondary | ICD-10-CM | POA: Diagnosis not present

## 2016-09-09 DIAGNOSIS — K649 Unspecified hemorrhoids: Secondary | ICD-10-CM

## 2016-09-09 DIAGNOSIS — J301 Allergic rhinitis due to pollen: Secondary | ICD-10-CM

## 2016-09-09 MED ORDER — LORATADINE 10 MG PO TABS: 10.0000 mg | ORAL_TABLET | Freq: Every day | ORAL | 11 refills | Status: DC

## 2016-09-09 MED ORDER — MEDROXYPROGESTERONE ACETATE 150 MG/ML IM SUSP
150.0000 mg | Freq: Once | INTRAMUSCULAR | Status: AC
  Administered 2016-09-09: 150 mg via INTRAMUSCULAR

## 2016-09-09 NOTE — Addendum Note (Signed)
Addended by: Lewie Loron D on: 09/09/2016 11:57 AM   Modules accepted: Orders

## 2016-09-09 NOTE — Progress Notes (Signed)
Pt was given depo injection at today's visit.  Pt tolerated injection well. Pt advised to return for next depo December 01, 2016.   Administrations This Visit    medroxyPROGESTERone (DEPO-PROVERA) injection 150 mg    Admin Date 09/09/2016 Action Given Dose 150 mg Route Intramuscular Administered By Valene Bors, CMA

## 2016-09-09 NOTE — Progress Notes (Signed)
Subjective:        Jean Ali is a 31 y.o. female here for a routine exam.  Current complaints: Hemorrhoids that are irritated by constipation.  The hemorrhoids remained after last delivery, and now are physically irritating with BM's.  Also bothered with seasonal allergies to pollen.  Personal health questionnaire:  Is patient [name], have a family history of breast and/or ovarian cancer: no Is there a family history of uterine cancer diagnosed at age < 81, gastrointestinal cancer, urinary tract cancer, family member who is a Field seismologist syndrome-associated carrier: no Is the patient overweight and hypertensive, family history of diabetes, personal history of gestational diabetes, preeclampsia or PCOS: no Is patient over 27, have PCOS,  family history of premature CHD under age 23, diabetes, smoke, have hypertension or peripheral artery disease:  no At any time, has a partner hit, kicked or otherwise hurt or frightened you?: no Over the past 2 weeks, have you felt down, depressed or hopeless?: no Over the past 2 weeks, have you felt little interest or pleasure in doing things?:no   Gynecologic History No LMP recorded. Patient has had an injection. Contraception: none Last Pap: 2015. Results were: normal Last mammogram: n/a. Results were: n/a  Obstetric History OB History  Gravida Para Term Preterm AB Living  4 2 2   2 2   SAB TAB Ectopic Multiple Live Births  1 1     2     # Outcome Date GA Lbr Len/2nd Weight Sex Delivery Anes PTL Lv  4 Term 04/08/12 [redacted]w[redacted]d 04:13 / 00:07 6 lb 7 oz (2.92 kg) F Vag-Spont EPI  LIV     Birth Comments: None  3 TAB 2008          2 Term 2006 [redacted]w[redacted]d  6 lb 11 oz (3.033 kg) F Vag-Spont None N LIV  1 SAB 2005              Past Medical History:  Diagnosis Date  . Asthma   . Depression    pp 2006 no meds  . Fibroid   . Hyperemesis gravidarum     Past Surgical History:  Procedure Laterality Date  . DILATION AND CURETTAGE OF  UTERUS    . THERAPEUTIC ABORTION       Current Outpatient Prescriptions:  .  acetaminophen (TYLENOL) 500 MG tablet, Take 500 mg by mouth every 6 (six) hours as needed for headache., Disp: , Rfl:  .  aspirin-acetaminophen-caffeine (EXCEDRIN MIGRAINE) 696-789-38 MG per tablet, Take 2 tablets by mouth every 6 (six) hours as needed for migraine., Disp: , Rfl:  .  ibuprofen (ADVIL,MOTRIN) 200 MG tablet, Take 400 mg by mouth every 6 (six) hours as needed for headache., Disp: , Rfl:  .  loratadine (CLARITIN) 10 MG tablet, Take 1 tablet (10 mg total) by mouth daily., Disp: 30 tablet, Rfl: 11 .  medroxyPROGESTERone (DEPO-PROVERA) 150 MG/ML injection, Inject 1 mL (150 mg total) into the muscle once., Disp: 1 mL, Rfl: 3 Allergies  Allergen Reactions  . Penicillins Hives    Social History  Substance Use Topics  . Smoking status: Current Every Day Smoker    Types: Cigars  . Smokeless tobacco: Never Used     Comment: occasional Black and Mild  . Alcohol use Yes     Comment: occasional    Family History  Problem Relation Age of Onset  . Cancer Maternal Grandmother   . Asthma Sister   . Asthma Daughter   . Asthma  Maternal Aunt   . Anesthesia problems Neg Hx   . Hypotension Neg Hx   . Malignant hyperthermia Neg Hx   . Pseudochol deficiency Neg Hx       Review of Systems  Constitutional: negative for fatigue and weight loss Respiratory: positive for seasonal allergic rhinitis Cardiovascular: negative for chest pain, fatigue and palpitations Gastrointestinal: negative for abdominal pain and change in bowel habits.  Positive for constipation and irritated hemorrhoids Musculoskeletal:negative for myalgias Neurological: negative for gait problems and tremors Behavioral/Psych: negative for abusive relationship, depression Endocrine: negative for temperature intolerance    Genitourinary:negative for abnormal menstrual periods, genital lesions, hot flashes, sexual problems and vaginal  discharge Integument/breast: negative for breast lump, breast tenderness, nipple discharge and skin lesion(s)    Objective:       BP 120/77   Pulse (!) 101   Ht 5\' 5"  (1.651 m)   Wt 174 lb (78.9 kg)   BMI 28.96 kg/m  General:   alert  Skin:   no rash or abnormalities  Lungs:   clear to auscultation bilaterally  Heart:   regular rate and rhythm, S1, S2 normal, no murmur, click, rub or gallop  Breasts:   normal without suspicious masses, skin or nipple changes or axillary nodes  Abdomen:  normal findings: no organomegaly, soft, non-tender and no hernia  Pelvis:  External genitalia: normal general appearance Urinary system: urethral meatus normal and bladder without fullness, nontender Vaginal: normal without tenderness, induration or masses Cervix: normal appearance Adnexa: normal bimanual exam Uterus: anteverted and non-tender, normal size   Lab Review Urine pregnancy test Labs reviewed yes Radiologic studies reviewed no  50% of 20 min visit spent on counseling and coordination of care.    Assessment:    Healthy female exam.   Contraceptive Counseling and Advice.  Wants Depo Provera Hemorrhoids, symptomatic Seasonal Allergic Rhinitis to pollen   Plan:   Depo Provera Rx Referred to General Surgery for evaluation of hemorrhoids Claritin Rx  Education reviewed: calcium supplements, depression evaluation, low fat, low cholesterol diet and safe sex/STD prevention. Contraception: Depo-Provera injections. Follow up in: 1 year.   Meds ordered this encounter  Medications  . loratadine (CLARITIN) 10 MG tablet    Sig: Take 1 tablet (10 mg total) by mouth daily.    Dispense:  30 tablet    Refill:  11   Orders Placed This Encounter  Procedures  . Ambulatory referral to General Surgery    Referral Priority:   Routine    Referral Type:   Surgical    Referral Reason:   Specialty Services Required    Requested Specialty:   General Surgery    Number of Visits Requested:    1     Patient ID: Jean Ali, female   DOB: [birth year], 31 y.o.   MRN: [id number]

## 2016-09-10 LAB — CERVICOVAGINAL ANCILLARY ONLY
BACTERIAL VAGINITIS: NEGATIVE
Candida vaginitis: POSITIVE — AB
Chlamydia: NEGATIVE
Neisseria Gonorrhea: NEGATIVE
TRICH (WINDOWPATH): NEGATIVE

## 2016-09-11 ENCOUNTER — Other Ambulatory Visit: Payer: Self-pay | Admitting: Obstetrics

## 2016-09-11 DIAGNOSIS — B373 Candidiasis of vulva and vagina: Secondary | ICD-10-CM

## 2016-09-11 DIAGNOSIS — B3731 Acute candidiasis of vulva and vagina: Secondary | ICD-10-CM

## 2016-09-11 MED ORDER — FLUCONAZOLE 150 MG PO TABS: 150.0000 mg | ORAL_TABLET | Freq: Once | ORAL | 0 refills | Status: AC

## 2016-09-13 LAB — CYTOLOGY - PAP
DIAGNOSIS: UNDETERMINED — AB
HPV (WINDOPATH): NOT DETECTED

## 2016-11-29 ENCOUNTER — Ambulatory Visit (INDEPENDENT_AMBULATORY_CARE_PROVIDER_SITE_OTHER): Payer: Medicaid Other

## 2016-11-29 ENCOUNTER — Other Ambulatory Visit: Payer: Self-pay | Admitting: Obstetrics

## 2016-11-29 DIAGNOSIS — Z309 Encounter for contraceptive management, unspecified: Secondary | ICD-10-CM

## 2016-11-29 DIAGNOSIS — Z308 Encounter for other contraceptive management: Secondary | ICD-10-CM

## 2016-11-29 DIAGNOSIS — Z3042 Encounter for surveillance of injectable contraceptive: Secondary | ICD-10-CM

## 2016-11-29 MED ORDER — MEDROXYPROGESTERONE ACETATE 150 MG/ML IM SUSP
150.0000 mg | Freq: Once | INTRAMUSCULAR | Status: AC
  Administered 2016-11-29: 150 mg via INTRAMUSCULAR

## 2016-11-29 NOTE — Progress Notes (Signed)
Nurse visit for pt supplied Depo given L upper outer quad w/o c/o. Next Depo due 10/1-10/15 pt agrees

## 2017-02-14 ENCOUNTER — Ambulatory Visit: Payer: Medicaid Other

## 2017-07-27 ENCOUNTER — Ambulatory Visit (INDEPENDENT_AMBULATORY_CARE_PROVIDER_SITE_OTHER): Payer: Medicaid Other

## 2017-07-27 DIAGNOSIS — Z3049 Encounter for surveillance of other contraceptives: Secondary | ICD-10-CM

## 2017-07-27 DIAGNOSIS — Z3042 Encounter for surveillance of injectable contraceptive: Secondary | ICD-10-CM

## 2017-07-27 LAB — POCT URINE PREGNANCY: Preg Test, Ur: NEGATIVE

## 2017-07-27 NOTE — Progress Notes (Signed)
Pt here for a depo restart. Pregnancy test today is negative. Pt will return in 2 weeks for a repeat pregnancy test. If test is negative, and she has not had unprotected sex she can receive depo at that time.

## 2017-07-27 NOTE — Progress Notes (Signed)
Agree with A/P as noted

## 2017-08-10 ENCOUNTER — Ambulatory Visit (INDEPENDENT_AMBULATORY_CARE_PROVIDER_SITE_OTHER): Payer: Medicaid Other

## 2017-08-10 DIAGNOSIS — Z309 Encounter for contraceptive management, unspecified: Secondary | ICD-10-CM | POA: Diagnosis not present

## 2017-08-10 DIAGNOSIS — Z3042 Encounter for surveillance of injectable contraceptive: Secondary | ICD-10-CM

## 2017-08-10 LAB — POCT URINE PREGNANCY: Preg Test, Ur: NEGATIVE

## 2017-08-10 MED ORDER — MEDROXYPROGESTERONE ACETATE 150 MG/ML IM SUSP
150.0000 mg | Freq: Once | INTRAMUSCULAR | Status: AC
  Administered 2017-08-10: 150 mg via INTRAMUSCULAR

## 2017-08-10 NOTE — Progress Notes (Signed)
Nurse visit for 2nd UPT. Denies IC x 14 days. UPT neg. Pt supplied Depo given L upper outer quad w/o difficulty. Next Depo due 6/12-26 pt agrees

## 2017-08-29 ENCOUNTER — Ambulatory Visit: Payer: Medicaid Other | Admitting: Obstetrics

## 2017-08-31 ENCOUNTER — Ambulatory Visit: Payer: Medicaid Other | Admitting: Obstetrics

## 2017-09-09 ENCOUNTER — Ambulatory Visit: Payer: Medicaid Other | Admitting: Obstetrics

## 2017-09-09 ENCOUNTER — Encounter: Payer: Self-pay | Admitting: Obstetrics

## 2017-09-09 VITALS — BP 117/70 | HR 98 | Ht 65.0 in | Wt 173.2 lb

## 2017-09-09 NOTE — Progress Notes (Signed)
Patient presents for Annual Exam today.  CC: NONE Pt states she had abortion procedure this week   Last pap: 09/09/2016 (ASC-US)  LMP: N/A   Contraception: was on Depo.  STD Screening: Desires ALL

## 2017-09-09 NOTE — Progress Notes (Signed)
I have reviewed the chart and agree with nursing staff's documentation of this patient's encounter.  Baltazar Najjar, MD 09/09/2017 12:33 PM

## 2017-10-20 ENCOUNTER — Other Ambulatory Visit (HOSPITAL_COMMUNITY)
Admission: RE | Admit: 2017-10-20 | Discharge: 2017-10-20 | Disposition: A | Discharge status: Home / Self Care | Payer: Managed Care, Other (non HMO) | Source: Ambulatory Visit | Attending: Obstetrics | Admitting: Obstetrics

## 2017-10-20 ENCOUNTER — Ambulatory Visit (INDEPENDENT_AMBULATORY_CARE_PROVIDER_SITE_OTHER): Payer: Managed Care, Other (non HMO) | Admitting: Obstetrics

## 2017-10-20 ENCOUNTER — Encounter: Payer: Self-pay | Admitting: Obstetrics

## 2017-10-20 VITALS — BP 124/83 | HR 88 | Ht 65.0 in | Wt 167.6 lb

## 2017-10-20 DIAGNOSIS — Z01419 Encounter for gynecological examination (general) (routine) without abnormal findings: Secondary | ICD-10-CM | POA: Diagnosis present

## 2017-10-20 DIAGNOSIS — Z309 Encounter for contraceptive management, unspecified: Secondary | ICD-10-CM | POA: Diagnosis not present

## 2017-10-20 DIAGNOSIS — Z3042 Encounter for surveillance of injectable contraceptive: Secondary | ICD-10-CM

## 2017-10-20 DIAGNOSIS — N898 Other specified noninflammatory disorders of vagina: Secondary | ICD-10-CM

## 2017-10-20 DIAGNOSIS — N946 Dysmenorrhea, unspecified: Secondary | ICD-10-CM

## 2017-10-20 DIAGNOSIS — E569 Vitamin deficiency, unspecified: Secondary | ICD-10-CM

## 2017-10-20 MED ORDER — MEDROXYPROGESTERONE ACETATE 150 MG/ML IM SUSP: INTRAMUSCULAR | 4 refills | Status: DC

## 2017-10-20 MED ORDER — PNV PRENATAL PLUS MULTIVITAMIN 27-1 MG PO TABS
1.0000 | ORAL_TABLET | Freq: Every day | ORAL | 11 refills | Status: DC
Start: 2017-10-20 — End: 2022-10-07

## 2017-10-20 MED ORDER — IBUPROFEN 800 MG PO TABS: 800.0000 mg | ORAL_TABLET | Freq: Three times a day (TID) | ORAL | 5 refills | Status: DC | PRN

## 2017-10-20 NOTE — Progress Notes (Signed)
Subjective:        Jean Ali is a 32 y.o. female here for a routine exam.  Current complaints: None.    Personal health questionnaire:  Is patient Jean Ali, have a family history of breast and/or ovarian cancer: no Is there a family history of uterine cancer diagnosed at age < 38, gastrointestinal cancer, urinary tract cancer, family member who is a Field seismologist syndrome-associated carrier: no Is the patient overweight and hypertensive, family history of diabetes, personal history of gestational diabetes, preeclampsia or PCOS: no Is patient over 81, have PCOS,  family history of premature CHD under age 78, diabetes, smoke, have hypertension or peripheral artery disease:  no At any time, has a partner hit, kicked or otherwise hurt or frightened you?: no Over the past 2 weeks, have you felt down, depressed or hopeless?: no Over the past 2 weeks, have you felt little interest or pleasure in doing things?:no   Gynecologic History No LMP recorded. Patient has had an injection. Contraception: Depo-Provera injections Last Pap: 2018. Results were: ASCUS with negative HPV Last mammogram: n/a. Results were: n/a  Obstetric History OB History  Gravida Para Term Preterm AB Living  5 2 2   3 2   SAB TAB Ectopic Multiple Live Births  1 2     2     # Outcome Date GA Lbr Len/2nd Weight Sex Delivery Anes PTL Lv  5 TAB 09/05/17          4 Term 04/08/12 [redacted]w[redacted]d 04:13 / 00:07 6 lb 7 oz (2.92 kg) F Vag-Spont EPI  LIV     Birth Comments: None  3 TAB 2008          2 Term 2006 [redacted]w[redacted]d  6 lb 11 oz (3.033 kg) F Vag-Spont None N LIV  1 SAB 2005            Past Medical History:  Diagnosis Date  . Asthma   . Depression    pp 2006 no meds  . Fibroid   . Hyperemesis gravidarum     Past Surgical History:  Procedure Laterality Date  . DILATION AND CURETTAGE OF UTERUS    . THERAPEUTIC ABORTION       Current Outpatient Medications:  .  medroxyPROGESTERone (DEPO-PROVERA) 150 MG/ML  injection, INJECT 1 MILLILITERS INTO THE MUSCLE ONCE  AS DIRECTED, Disp: 1 mL, Rfl: 4 .  acetaminophen (TYLENOL) 500 MG tablet, Take 500 mg by mouth every 6 (six) hours as needed for headache., Disp: , Rfl:  .  aspirin-acetaminophen-caffeine (EXCEDRIN MIGRAINE) 400-867-61 MG per tablet, Take 2 tablets by mouth every 6 (six) hours as needed for migraine., Disp: , Rfl:  .  ibuprofen (ADVIL,MOTRIN) 800 MG tablet, Take 1 tablet (800 mg total) by mouth every 8 (eight) hours as needed., Disp: 30 tablet, Rfl: 5 .  loratadine (CLARITIN) 10 MG tablet, Take 1 tablet (10 mg total) by mouth daily. (Patient not taking: Reported on 09/09/2017), Disp: 30 tablet, Rfl: 11 .  Prenatal Vit-Fe Fumarate-FA (PNV PRENATAL PLUS MULTIVITAMIN) 27-1 MG TABS, Take 1 tablet by mouth daily before breakfast., Disp: 30 tablet, Rfl: 11 Allergies  Allergen Reactions  . Penicillins Hives    Social History   Tobacco Use  . Smoking status: Former Smoker    Types: Cigars  . Smokeless tobacco: Never Used  . Tobacco comment: occasional Black and Mild  Substance Use Topics  . Alcohol use: Yes    Comment: occasional    Family History  Problem Relation  Age of Onset  . Cancer Maternal Grandmother   . Asthma Sister   . Asthma Daughter   . Asthma Maternal Aunt   . Liver cancer Father   . Anesthesia problems Neg Hx   . Hypotension Neg Hx   . Malignant hyperthermia Neg Hx   . Pseudochol deficiency Neg Hx       Review of Systems  Constitutional: negative for fatigue and weight loss Respiratory: negative for cough and wheezing Cardiovascular: negative for chest pain, fatigue and palpitations Gastrointestinal: negative for abdominal pain and change in bowel habits Musculoskeletal:negative for myalgias Neurological: negative for gait problems and tremors Behavioral/Psych: negative for abusive relationship, depression Endocrine: negative for temperature intolerance    Genitourinary:negative for abnormal menstrual periods,  genital lesions, hot flashes, sexual problems and vaginal discharge Integument/breast: negative for breast lump, breast tenderness, nipple discharge and skin lesion(s)    Objective:       BP 124/83   Pulse 88   Ht 5\' 5"  (1.651 m)   Wt 167 lb 9.6 oz (76 kg)   BMI 27.89 kg/m  General:   alert  Skin:   no rash or abnormalities  Lungs:   clear to auscultation bilaterally  Heart:   regular rate and rhythm, S1, S2 normal, no murmur, click, rub or gallop  Breasts:   normal without suspicious masses, skin or nipple changes or axillary nodes  Abdomen:  normal findings: no organomegaly, soft, non-tender and no hernia  Pelvis:  External genitalia: normal general appearance Urinary system: urethral meatus normal and bladder without fullness, nontender Vaginal: normal without tenderness, induration or masses Cervix: normal appearance Adnexa: normal bimanual exam Uterus: anteverted and non-tender, normal size   Lab Review Urine pregnancy test Labs reviewed yes Radiologic studies reviewed no  50% of 20 min visit spent on counseling and coordination of care.   Assessment:     1. Encounter for routine gynecological examination with Papanicolaou smear of cervix Rx: - Cytology - PAP  2. Vaginal discharge Rx: - Cervicovaginal ancillary only  3. Dysmenorrhea Rx: - ibuprofen (ADVIL,MOTRIN) 800 MG tablet; Take 1 tablet (800 mg total) by mouth every 8 (eight) hours as needed.  Dispense: 30 tablet; Refill: 5  4. Vitamin deficiency Rx: - Prenatal Vit-Fe Fumarate-FA (PNV PRENATAL PLUS MULTIVITAMIN) 27-1 MG TABS; Take 1 tablet by mouth daily before breakfast.  Dispense: 30 tablet; Refill: 11  5. Encounter for contraceptive management Rx: - medroxyPROGESTERone (DEPO-PROVERA) 150 MG/ML injection; INJECT 1 MILLILITERS INTO THE MUSCLE ONCE  AS DIRECTED  Dispense: 1 mL; Refill: 4    Plan:    Education reviewed: calcium supplements, depression evaluation, low fat, low cholesterol diet, safe  sex/STD prevention, self breast exams and weight bearing exercise. Contraception: Depo-Provera injections. Follow up in: 1 year.   Meds ordered this encounter  Medications  . ibuprofen (ADVIL,MOTRIN) 800 MG tablet    Sig: Take 1 tablet (800 mg total) by mouth every 8 (eight) hours as needed.    Dispense:  30 tablet    Refill:  5  . Prenatal Vit-Fe Fumarate-FA (PNV PRENATAL PLUS MULTIVITAMIN) 27-1 MG TABS    Sig: Take 1 tablet by mouth daily before breakfast.    Dispense:  30 tablet    Refill:  11  . medroxyPROGESTERone (DEPO-PROVERA) 150 MG/ML injection    Sig: INJECT 1 MILLILITERS INTO THE MUSCLE ONCE  AS DIRECTED    Dispense:  1 mL    Refill:  4   No orders of the defined types were  placed in this encounter.   Shelly Bombard MD 10-20-2017

## 2017-10-21 LAB — CERVICOVAGINAL ANCILLARY ONLY
CHLAMYDIA, DNA PROBE: NEGATIVE
NEISSERIA GONORRHEA: NEGATIVE

## 2017-10-25 LAB — CYTOLOGY - PAP
Diagnosis: NEGATIVE
HPV: NOT DETECTED

## 2017-11-15 ENCOUNTER — Ambulatory Visit (HOSPITAL_COMMUNITY)
Admission: EM | Admit: 2017-11-15 | Discharge: 2017-11-15 | Disposition: A | Discharge status: Home / Self Care | Payer: Managed Care, Other (non HMO) | Attending: Family Medicine | Admitting: Family Medicine

## 2017-11-15 ENCOUNTER — Encounter (HOSPITAL_COMMUNITY): Payer: Self-pay | Admitting: Emergency Medicine

## 2017-11-15 DIAGNOSIS — Z87891 Personal history of nicotine dependence: Secondary | ICD-10-CM | POA: Diagnosis not present

## 2017-11-15 DIAGNOSIS — N898 Other specified noninflammatory disorders of vagina: Secondary | ICD-10-CM | POA: Diagnosis not present

## 2017-11-15 DIAGNOSIS — Z3202 Encounter for pregnancy test, result negative: Secondary | ICD-10-CM | POA: Diagnosis not present

## 2017-11-15 DIAGNOSIS — F329 Major depressive disorder, single episode, unspecified: Secondary | ICD-10-CM | POA: Insufficient documentation

## 2017-11-15 DIAGNOSIS — J45909 Unspecified asthma, uncomplicated: Secondary | ICD-10-CM | POA: Diagnosis not present

## 2017-11-15 LAB — POCT URINALYSIS DIP (DEVICE)
Bilirubin Urine: NEGATIVE
GLUCOSE, UA: NEGATIVE mg/dL
Hgb urine dipstick: NEGATIVE
Ketones, ur: NEGATIVE mg/dL
NITRITE: NEGATIVE
PROTEIN: NEGATIVE mg/dL
Specific Gravity, Urine: 1.02 (ref 1.005–1.030)
UROBILINOGEN UA: 1 mg/dL (ref 0.0–1.0)
pH: 7 (ref 5.0–8.0)

## 2017-11-15 LAB — POCT PREGNANCY, URINE: PREG TEST UR: NEGATIVE

## 2017-11-15 MED ORDER — METRONIDAZOLE 0.75 % VA GEL: 1.0000 | Freq: Two times a day (BID) | VAGINAL | 0 refills | Status: AC

## 2017-11-15 NOTE — ED Provider Notes (Signed)
[facility]    CSN: [id number] Arrival date & time: 11/15/17  1713     History   Chief Complaint Chief Complaint  Patient presents with  . Vaginal Discharge    HPI Jean Ali is a 32 y.o. female.   32 year old female comes in with a few month history of intermittent recurring vaginal discharge with odor.  Urinary frequency does not consistent.  Denies dysuria, hematuria.  Denies vaginal pain.  Does have some spotting, states she had an abortion 09/06/2017, and had depot injection shortly after.  Denies fever, chills, night sweats.  Does have intermittent abdominal cramping.  Denies nausea/vomiting.  Does have straining with bowel movement.  Has recently started to use different body washes, no other hygiene products changes.  Sexually active with one female partner.  Patient states usually with consistent condom use, but recently had a condom break.  Does not get cycles due to depot injection.      Past Medical History:  Diagnosis Date  . Asthma   . Depression    pp 2006 no meds  . Fibroid   . Hyperemesis gravidarum     Patient Active Problem List   Diagnosis Date Noted  . ASTHMA, UNSPECIFIED 07/14/2006    Past Surgical History:  Procedure Laterality Date  . DILATION AND CURETTAGE OF UTERUS    . THERAPEUTIC ABORTION      OB History    Gravida  5   Para  2   Term  2   Preterm      AB  3   Living  2     SAB  1   TAB  2   Ectopic      Multiple      Live Births  2            Home Medications    Prior to Admission medications   Medication Sig Start Date End Date Taking? Authorizing Provider  acetaminophen (TYLENOL) 500 MG tablet Take 500 mg by mouth every 6 (six) hours as needed for headache.    [provider]  aspirin-acetaminophen-caffeine (EXCEDRIN MIGRAINE) [phone] MG per tablet Take 2 tablets by mouth every 6 (six) hours as needed for migraine.    [provider]  ibuprofen (ADVIL,MOTRIN) 800  MG tablet Take 1 tablet (800 mg total) by mouth every 8 (eight) hours as needed. 10/20/17   Shelly Bombard, MD  loratadine (CLARITIN) 10 MG tablet Take 1 tablet (10 mg total) by mouth daily. Patient not taking: Reported on 09/09/2017 09/09/16   Shelly Bombard, MD  medroxyPROGESTERone (DEPO-PROVERA) 150 MG/ML injection INJECT 1 MILLILITERS INTO THE MUSCLE ONCE  AS DIRECTED 10/20/17   Shelly Bombard, MD  metroNIDAZOLE (METROGEL VAGINAL) 0.75 % vaginal gel Place 1 Applicatorful vaginally 2 (two) times daily for 5 days. 11/15/17 11/20/17  Ok Edwards, PA-C  Prenatal Vit-Fe Fumarate-FA (PNV PRENATAL PLUS MULTIVITAMIN) 27-1 MG TABS Take 1 tablet by mouth daily before breakfast. 10/20/17   Shelly Bombard, MD    Family History Family History  Problem Relation Age of Onset  . Cancer Maternal Grandmother   . Asthma Sister   . Asthma Daughter   . Asthma Maternal Aunt   . Liver cancer Father   . Anesthesia problems Neg Hx   . Hypotension Neg Hx   . Malignant hyperthermia Neg Hx   . Pseudochol deficiency Neg Hx     Social History Social History   Tobacco Use  . Smoking  status: Former Smoker    Types: Cigars  . Smokeless tobacco: Never Used  . Tobacco comment: occasional Black and Mild  Substance Use Topics  . Alcohol use: Yes    Comment: occasional  . Drug use: No     Allergies   Penicillins   Review of Systems Review of Systems  Reason unable to perform ROS: See HPI as above.     Physical Exam Triage Vital Signs ED Triage Vitals [11/15/17 1753]  Enc Vitals Group     BP 130/76     Pulse Rate 77     Resp 18     Temp 98.3 F (36.8 C)     Temp Source Oral     SpO2 100 %     Weight      Height      Head Circumference      Peak Flow      Pain Score      Pain Loc      Pain Edu?      Excl. in [place]?    No data found.  Updated Vital Signs BP 130/76 (BP Location: Left Arm)   Pulse 77   Temp 98.3 F (36.8 C) (Oral)   Resp 18   SpO2 100%   Physical Exam    Constitutional: She is oriented to person, place, and time. She appears well-developed and well-nourished. No distress.  HENT:  Head: Normocephalic and atraumatic.  Eyes: Pupils are equal, round, and reactive to light. Conjunctivae are normal.  Cardiovascular: Normal rate, regular rhythm and normal heart sounds. Exam reveals no gallop and no friction rub.  No murmur heard. Pulmonary/Chest: Effort normal and breath sounds normal. She has no wheezes. She has no rales.  Abdominal: Soft. Bowel sounds are normal. She exhibits no mass. There is no tenderness. There is no rebound, no guarding and no CVA tenderness.  Neurological: She is alert and oriented to person, place, and time.  Skin: Skin is warm and dry.  Psychiatric: She has a normal mood and affect. Her behavior is normal. Judgment normal.     UC Treatments / Results  Labs (all labs ordered are listed, but only abnormal results are displayed) Labs Reviewed  POCT URINALYSIS DIP (DEVICE) - Abnormal; Notable for the following components:      Result Value   Leukocytes, UA TRACE (*)    All other components within normal limits  URINE CULTURE  CERVICOVAGINAL ANCILLARY ONLY    EKG None  Radiology No results found.  Procedures Procedures (including critical care time)  Medications Ordered in UC Medications - No data to display  Initial Impression / Assessment and Plan / UC Course  I have reviewed the triage vital signs and the nursing notes.  Pertinent labs & imaging results that were available during my care of the patient were reviewed by me and considered in my medical decision making (see chart for details).    Patient was treated empirically for BV. Metrogel as directed. Cytology sent, patient will be contacted with any positive results that require additional treatment. Patient to refrain from sexual activity for the next 7 days. Return precautions given.   Final Clinical Impressions(s) / UC Diagnoses   Final  diagnoses:  Vaginal discharge    ED Prescriptions    Medication Sig Dispense Auth. Provider   metroNIDAZOLE (METROGEL VAGINAL) 0.75 % vaginal gel Place 1 Applicatorful vaginally 2 (two) times daily for 5 days. 100 g Ok Edwards, PA-C  Ok Edwards, PA-C 11/15/17 1844

## 2017-11-15 NOTE — Discharge Instructions (Signed)
Urine not conclusive for infection, culture sent. You were treated empirically for bacterial vaginitis. Metrogel as directed. Cytology sent, you will be contacted with any positive results that requires further treatment. Refrain from sexual activity for the next 7 days. Monitor for any worsening of symptoms, fever, abdominal pain, nausea, vomiting, to follow up for reevaluation.

## 2017-11-15 NOTE — ED Triage Notes (Signed)
Pt sts vaginal discharge with odor

## 2017-11-16 LAB — CERVICOVAGINAL ANCILLARY ONLY
Bacterial vaginitis: POSITIVE — AB
CANDIDA VAGINITIS: NEGATIVE
CHLAMYDIA, DNA PROBE: NEGATIVE
NEISSERIA GONORRHEA: NEGATIVE
Trichomonas: NEGATIVE

## 2017-11-16 LAB — URINE CULTURE: Culture: <10000 — AB

## 2017-11-18 ENCOUNTER — Telehealth (HOSPITAL_COMMUNITY): Payer: Self-pay

## 2017-11-18 MED ORDER — METRONIDAZOLE 500 MG PO TABS: 500.0000 mg | ORAL_TABLET | Freq: Two times a day (BID) | ORAL | 0 refills | Status: DC

## 2017-11-18 NOTE — Telephone Encounter (Signed)
Urine culture does not suggest uti. Bacterial Vaginosis test is positive.  Prescription for metronidazole was given at the urgent care visit. Pt contacted regarding results. Answered all questions. Verbalized understanding. Pt requesting pills instead of the gel. Rx sent to pharmacy of choice.

## 2017-12-20 ENCOUNTER — Telehealth: Payer: Self-pay

## 2017-12-20 NOTE — Telephone Encounter (Signed)
Pt left message on triage vm wanting to discuss depo. Attempted to call pt. No answer. Left vm for pt to return call

## 2018-01-04 ENCOUNTER — Ambulatory Visit (INDEPENDENT_AMBULATORY_CARE_PROVIDER_SITE_OTHER): Payer: Managed Care, Other (non HMO)

## 2018-01-04 VITALS — BP 117/78 | HR 87 | Wt 172.2 lb

## 2018-01-04 DIAGNOSIS — Z3202 Encounter for pregnancy test, result negative: Secondary | ICD-10-CM | POA: Diagnosis not present

## 2018-01-04 DIAGNOSIS — Z3042 Encounter for surveillance of injectable contraceptive: Secondary | ICD-10-CM

## 2018-01-04 LAB — POCT URINE PREGNANCY: Preg Test, Ur: NEGATIVE

## 2018-01-04 MED ORDER — MEDROXYPROGESTERONE ACETATE 150 MG/ML IM SUSP
150.0000 mg | Freq: Once | INTRAMUSCULAR | Status: AC
  Administered 2018-01-04: 150 mg via INTRAMUSCULAR

## 2018-01-04 NOTE — Progress Notes (Signed)
Presents for DEPO, UPT today is NEGATIVE. Given in [place], tolerated well.  Next DEPO 11/6-20/2019  Administrations This Visit    medroxyPROGESTERone (DEPO-PROVERA) injection 150 mg    Admin Date 01/04/2018 Action Given Dose 150 mg Route Intramuscular Administered By Tamela Oddi, RMA

## 2018-03-28 ENCOUNTER — Ambulatory Visit (INDEPENDENT_AMBULATORY_CARE_PROVIDER_SITE_OTHER): Payer: Managed Care, Other (non HMO)

## 2018-03-28 DIAGNOSIS — Z3042 Encounter for surveillance of injectable contraceptive: Secondary | ICD-10-CM | POA: Diagnosis not present

## 2018-03-28 MED ORDER — MEDROXYPROGESTERONE ACETATE 150 MG/ML IM SUSP
150.0000 mg | INTRAMUSCULAR | Status: DC
  Administered 2018-03-28: 150 mg via INTRAMUSCULAR

## 2018-03-28 NOTE — Progress Notes (Signed)
Pt is in the office for depo injection, administered in [place], pt tolerated well. .. Administrations This Visit    medroxyPROGESTERone (DEPO-PROVERA) injection 150 mg    Admin Date 03/28/2018 Action Given Dose 150 mg Route Intramuscular Administered By Hinton Lovely, RN

## 2018-03-28 NOTE — Progress Notes (Signed)
I have reviewed the chart and agree with nursing staff's documentation of this patient's encounter.  Mora Bellman, MD 03/28/2018 2:14 PM

## 2018-11-24 ENCOUNTER — Encounter (HOSPITAL_COMMUNITY): Payer: Self-pay

## 2018-11-24 ENCOUNTER — Ambulatory Visit (HOSPITAL_COMMUNITY)
Admission: EM | Admit: 2018-11-24 | Discharge: 2018-11-24 | Disposition: A | Discharge status: Home / Self Care | Payer: Managed Care, Other (non HMO) | Attending: Internal Medicine | Admitting: Internal Medicine

## 2018-11-24 DIAGNOSIS — S0922XA Traumatic rupture of left ear drum, initial encounter: Secondary | ICD-10-CM | POA: Diagnosis not present
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
## 2018-11-27 DIAGNOSIS — H90A12 Conductive hearing loss, unilateral, left ear with restricted hearing on the contralateral side: Secondary | ICD-10-CM | POA: Insufficient documentation

## 2018-11-27 DIAGNOSIS — S0922XA Traumatic rupture of left ear drum, initial encounter: Secondary | ICD-10-CM | POA: Insufficient documentation

## 2019-07-26 ENCOUNTER — Ambulatory Visit (INDEPENDENT_AMBULATORY_CARE_PROVIDER_SITE_OTHER): Payer: Self-pay | Admitting: Adult Health Nurse Practitioner

## 2019-07-26 ENCOUNTER — Ambulatory Visit (INDEPENDENT_AMBULATORY_CARE_PROVIDER_SITE_OTHER): Payer: Medicaid Other

## 2019-07-26 ENCOUNTER — Other Ambulatory Visit: Payer: Self-pay

## 2019-07-26 VITALS — BP 137/87 | HR 93 | Temp 98.1°F | Ht 65.0 in | Wt 165.6 lb

## 2019-07-26 DIAGNOSIS — Z01818 Encounter for other preprocedural examination: Secondary | ICD-10-CM

## 2019-07-26 NOTE — Patient Instructions (Addendum)
° ° ° °  If you have lab work done today you will be contacted with your lab results within the next 2 weeks.  If you have not heard from us then please contact us. The fastest way to get your results is to register for My Chart. ° ° °IF you received an x-ray today, you will receive an invoice from [facility]. Please contact [facility] at [phone] with questions or concerns regarding your invoice.  ° °IF you received labwork today, you will receive an invoice from [facility]. Please contact [facility] at [phone] with questions or concerns regarding your invoice.  ° °Our billing staff will not be able to assist you with questions regarding bills from these companies. ° °You will be contacted with the lab results as soon as they are available. The fastest way to get your results is to activate your My Chart account. Instructions are located on the last page of this paperwork. If you have not heard from us regarding the results in 2 weeks, please contact this office. °  ° ° ° °

## 2019-07-26 NOTE — Progress Notes (Signed)
Chief Complaint  Patient presents with  . Establish Care    ekg Pt is having BBL and needs clearance.    HPI   Patient presents for preoperative clearance for impending surgery.  She is scheduled to have a Journalist, newspaper in [place] next month.  Required testing includes CXR, EKG, and exam.  No labs required.  Patient reports she has been mostly healthy her adult life.  Asthma only as A child.  No hx into adulthood. She endorses a hx of smoking blunts intermittenly weekly.  Has not in 2 weeks.  She is also avoiding mother who is a chain smoker in house as surgical instructions order no exposure to second hand smoke prior to procedure and for 7 days after.   She has a partner of 8 years, 2 children:  1 14 and the other one, 7.  She is self-employed as a Theme park manager.      Problem List    Problem List: 2008-02: ASTHMA, UNSPECIFIED   Allergies   is allergic to penicillins.  Medications    Current Outpatient Medications:  .  medroxyPROGESTERone (DEPO-PROVERA) 150 MG/ML injection, INJECT 1 MILLILITERS INTO THE MUSCLE ONCE  AS DIRECTED, Disp: 1 mL, Rfl: 4 .  Prenatal Vit-Fe Fumarate-FA (PNV PRENATAL PLUS MULTIVITAMIN) 27-1 MG TABS, Take 1 tablet by mouth daily before breakfast., Disp: 30 tablet, Rfl: 11 .  acetaminophen (TYLENOL) 500 MG tablet, Take 500 mg by mouth every 6 (six) hours as needed for headache., Disp: , Rfl:  .  aspirin-acetaminophen-caffeine (EXCEDRIN MIGRAINE) [id number] MG per tablet, Take 2 tablets by mouth every 6 (six) hours as needed for migraine., Disp: , Rfl:  .  ibuprofen (ADVIL,MOTRIN) 800 MG tablet, Take 1 tablet (800 mg total) by mouth every 8 (eight) hours as needed. (Patient not taking: Reported on 07/26/2019), Disp: 30 tablet, Rfl: 5  Current Facility-Administered Medications:  .  medroxyPROGESTERone (DEPO-PROVERA) injection 150 mg, 150 mg, Intramuscular, Q90 days, Constant, Peggy, MD, 150 mg at 03/28/18 1330   Review of Systems    Constitutional:  Negative for activity change, appetite change, chills and fever.  HENT: Negative for congestion, nosebleeds, trouble swallowing and voice change.   Respiratory: Negative for cough, shortness of breath and wheezing.   Cardiac:  Negative for chest pain, pressure, syncope  Gastrointestinal: Negative for diarrhea, nausea and vomiting.  Genitourinary: Negative for difficulty urinating, dysuria, flank pain and hematuria.  Musculoskeletal: Negative for back pain, joint swelling and neck pain.  Neurological: Negative for dizziness, speech difficulty, light-headedness and numbness.  See HPI. All other review of systems negative.     Physical Exam:    height is 5\' 5"  (1.651 m) and weight is 165 lb 9.6 oz (75.1 kg). Her temporal temperature is 98.1 F (36.7 C). Her blood pressure is 137/87 and her pulse is 93. Her oxygen saturation is 99%.   Physical Examination: General appearance - alert, well appearing, and in no distress and oriented to person, place, and time Mental status - normal mood, behavior, speech, dress, motor activity, and thought processes Eyes - PERRL. Extraocular movements intact.  No nystagmus.  Neck - supple, no significant adenopathy, carotids upstroke normal bilaterally, no bruits, thyroid exam: thyroid is normal in size without nodules or tenderness Chest - clear to auscultation, no wheezes, rales or rhonchi, symmetric air entry  Heart - normal rate, regular rhythm, normal S1, S2, no murmurs, rubs, clicks or gallops Extremities - dependent LE edema without clubbing or cyanosis Skin - normal coloration  and turgor, no rashes, no suspicious skin lesions noted  No hyperpigmentation of skin.  No current hematomas noted   Lab /Imaging Review   EKG with normal sinus rhythm, personally reviewed CXR:  Negative   Assessment & Plan:  Jean Ali is a 34 y.o. female    1. Preoperative clearance      No diagnosis found. Orders Placed This Encounter  Procedures  .  DG Chest 2 View  . EKG 12-Lead   .preop    Glyn Ade, [phone]

## 2019-07-27 ENCOUNTER — Encounter: Payer: Self-pay | Admitting: Adult Health Nurse Practitioner

## 2019-07-27 DIAGNOSIS — Z01818 Encounter for other preprocedural examination: Secondary | ICD-10-CM | POA: Insufficient documentation

## 2019-08-01 ENCOUNTER — Telehealth: Payer: Self-pay | Admitting: Adult Health Nurse Practitioner

## 2019-08-01 NOTE — Telephone Encounter (Signed)
Pt came in and picked up her $40 and also brought in ekg print out and imaging pint out  for provider to sign.

## 2019-08-15 ENCOUNTER — Other Ambulatory Visit: Payer: Self-pay

## 2019-08-15 ENCOUNTER — Ambulatory Visit (INDEPENDENT_AMBULATORY_CARE_PROVIDER_SITE_OTHER): Payer: Medicaid Other | Admitting: Adult Health Nurse Practitioner

## 2019-08-15 VITALS — BP 136/84 | HR 95 | Temp 98.2°F | Ht 65.0 in | Wt 165.2 lb

## 2019-08-15 DIAGNOSIS — Z0000 Encounter for general adult medical examination without abnormal findings: Secondary | ICD-10-CM

## 2019-08-15 DIAGNOSIS — Z01818 Encounter for other preprocedural examination: Secondary | ICD-10-CM

## 2019-08-15 NOTE — Patient Instructions (Signed)
° ° ° °  If you have lab work done today you will be contacted with your lab results within the next 2 weeks.  If you have not heard from us then please contact us. The fastest way to get your results is to register for My Chart. ° ° °IF you received an x-ray today, you will receive an invoice from [facility]. Please contact [facility] at [phone] with questions or concerns regarding your invoice.  ° °IF you received labwork today, you will receive an invoice from [facility]. Please contact [facility] at [phone] with questions or concerns regarding your invoice.  ° °Our billing staff will not be able to assist you with questions regarding bills from these companies. ° °You will be contacted with the lab results as soon as they are available. The fastest way to get your results is to activate your My Chart account. Instructions are located on the last page of this paperwork. If you have not heard from us regarding the results in 2 weeks, please contact this office. °  ° ° ° °

## 2019-08-21 ENCOUNTER — Telehealth: Payer: Self-pay | Admitting: Adult Health Nurse Practitioner

## 2019-08-21 ENCOUNTER — Ambulatory Visit: Payer: Medicaid Other | Admitting: Family Medicine

## 2019-08-21 ENCOUNTER — Other Ambulatory Visit: Payer: Self-pay

## 2019-08-21 DIAGNOSIS — Z01818 Encounter for other preprocedural examination: Secondary | ICD-10-CM

## 2019-08-21 NOTE — Telephone Encounter (Signed)
Papers will be placed up front awaiting for pick-up

## 2019-08-21 NOTE — Telephone Encounter (Signed)
Pt will be in to have labs drawn today and will have to wait til labs come back to pick up paper work. Please disregard the last message.

## 2019-08-21 NOTE — Telephone Encounter (Signed)
Patient is looking for paperwork that needed to be completed from her lab work. Patient will come by office to pick up later today

## 2019-08-22 ENCOUNTER — Ambulatory Visit: Payer: Medicaid Other | Admitting: Adult Health Nurse Practitioner

## 2019-08-22 LAB — CMP14+EGFR
ALT: 11 IU/L (ref 0–32)
AST: 17 IU/L (ref 0–40)
Albumin/Globulin Ratio: 2 (ref 1.2–2.2)
Albumin: 4.6 g/dL (ref 3.8–4.8)
Alkaline Phosphatase: 55 IU/L (ref 39–117)
BUN/Creatinine Ratio: 13 (ref 9–23)
BUN: 13 mg/dL (ref 6–20)
Bilirubin Total: 0.6 mg/dL (ref 0.0–1.2)
CO2: 19 mmol/L — ABNORMAL LOW (ref 20–29)
Calcium: 9.1 mg/dL (ref 8.7–10.2)
Chloride: 106 mmol/L (ref 96–106)
Creatinine, Ser: 0.98 mg/dL (ref 0.57–1.00)
GFR calc Af Amer: 88 mL/min/{1.73_m2} (ref >59)
GFR calc non Af Amer: 77 mL/min/{1.73_m2} (ref >59)
Globulin, Total: 2.3 g/dL (ref 1.5–4.5)
Glucose: 97 mg/dL (ref 65–99)
Potassium: 4.2 mmol/L (ref 3.5–5.2)
Sodium: 140 mmol/L (ref 134–144)
Total Protein: 6.9 g/dL (ref 6.0–8.5)

## 2019-08-22 LAB — CBC WITH DIFFERENTIAL/PLATELET
Basophils Absolute: 0 10*3/uL (ref 0.0–0.2)
Basos: 0 %
EOS (ABSOLUTE): 0.1 10*3/uL (ref 0.0–0.4)
Eos: 1 %
Hematocrit: 40.4 % (ref 34.0–46.6)
Hemoglobin: 14.2 g/dL (ref 11.1–15.9)
Immature Grans (Abs): 0 10*3/uL (ref 0.0–0.1)
Immature Granulocytes: 1 %
Lymphocytes Absolute: 2 10*3/uL (ref 0.7–3.1)
Lymphs: 30 %
MCH: 30.9 pg (ref 26.6–33.0)
MCHC: 35.1 g/dL (ref 31.5–35.7)
MCV: 88 fL (ref 79–97)
Monocytes Absolute: 0.4 10*3/uL (ref 0.1–0.9)
Monocytes: 7 %
Neutrophils Absolute: 4.1 10*3/uL (ref 1.4–7.0)
Neutrophils: 61 %
Platelets: 272 10*3/uL (ref 150–450)
RBC: 4.6 x10E6/uL (ref 3.77–5.28)
RDW: 11.7 % (ref 11.7–15.4)
WBC: 6.6 10*3/uL (ref 3.4–10.8)

## 2019-08-22 LAB — TSH: TSH: 0.648 u[IU]/mL (ref 0.450–4.500)

## 2019-08-22 LAB — APTT: aPTT: 30 s (ref 24–33)

## 2019-08-22 LAB — PROTIME-INR
INR: 1 (ref 0.9–1.2)
Prothrombin Time: 10.5 s (ref 9.1–12.0)

## 2019-08-24 ENCOUNTER — Telehealth: Payer: Self-pay

## 2019-08-24 NOTE — Telephone Encounter (Signed)
Called pt to let her know that her paperwork/labs are ready for pick-up. Papers are placed up front.

## 2019-08-27 NOTE — Telephone Encounter (Signed)
Pt called and stated that her HCG and Urinalasysis was not on her paperwork that she got on 4/9. Pt states this was her second time for these results. Pt is wanting to know what is going on with the results and would like them today in documentation. Please advise. [phone]

## 2019-08-27 NOTE — Telephone Encounter (Signed)
Called and spoke to pt her test was not run on the 6th for her initial appointment or on the 9th when she returned b/c the original was not run, and that too was not run. Pt is extremely upset that this is the second time we've lost her urine sample in a 1 week span and now she would have to leave a 3rd sample. Pt states "I will just have to call Pat" and hung up. I did apologize several times for the errors

## 2019-08-28 LAB — POCT URINALYSIS DIP (MANUAL ENTRY)
Bilirubin, UA: NEGATIVE
Blood, UA: NEGATIVE
Glucose, UA: NEGATIVE mg/dL
Ketones, POC UA: NEGATIVE mg/dL
Leukocytes, UA: NEGATIVE
Nitrite, UA: NEGATIVE
Protein Ur, POC: NEGATIVE mg/dL
Spec Grav, UA: 1.025 (ref 1.010–1.025)
Urobilinogen, UA: 0.2 E.U./dL
pH, UA: 5 (ref 5.0–8.0)

## 2019-08-28 LAB — POCT URINE PREGNANCY: Preg Test, Ur: NEGATIVE

## 2019-08-30 ENCOUNTER — Ambulatory Visit: Payer: Medicaid Other

## 2019-08-31 ENCOUNTER — Other Ambulatory Visit: Payer: Self-pay

## 2019-08-31 ENCOUNTER — Ambulatory Visit: Payer: Medicaid Other

## 2019-08-31 DIAGNOSIS — Z01818 Encounter for other preprocedural examination: Secondary | ICD-10-CM

## 2019-09-01 LAB — URINALYSIS, ROUTINE W REFLEX MICROSCOPIC
Bilirubin, UA: NEGATIVE
Glucose, UA: NEGATIVE
Ketones, UA: NEGATIVE
Leukocytes,UA: NEGATIVE
Nitrite, UA: NEGATIVE
Protein,UA: NEGATIVE
RBC, UA: NEGATIVE
Specific Gravity, UA: 1.021 (ref 1.005–1.030)
Urobilinogen, Ur: 0.2 mg/dL (ref 0.2–1.0)
pH, UA: 7 (ref 5.0–7.5)

## 2019-09-01 LAB — BETA HCG QUANT (REF LAB): hCG Quant: <1 m[IU]/mL

## 2019-09-03 ENCOUNTER — Telehealth: Payer: Self-pay | Admitting: Adult Health Nurse Practitioner

## 2019-09-03 NOTE — Telephone Encounter (Signed)
Pt called and is needing her labs that were done on 4/16 printed out so for her to pick up. Pt would like a call when they are ready for her to pick up. [phone] Please advise.

## 2019-09-04 ENCOUNTER — Ambulatory Visit: Payer: Medicaid Other | Admitting: Adult Health Nurse Practitioner

## 2019-09-04 ENCOUNTER — Other Ambulatory Visit: Payer: Self-pay

## 2019-09-04 DIAGNOSIS — Z01818 Encounter for other preprocedural examination: Secondary | ICD-10-CM

## 2019-09-04 NOTE — Telephone Encounter (Signed)
This was resolved by other means

## 2019-09-04 NOTE — Addendum Note (Signed)
Addended by: Meredeth Ide on: 09/04/2019 10:25 AM   Modules accepted: Orders

## 2019-09-05 LAB — URINALYSIS, MICROSCOPIC ONLY
Bacteria, UA: NONE SEEN
Casts: NONE SEEN /LPF
RBC: NONE SEEN /hpf (ref 0–2)
WBC, UA: NONE SEEN /hpf (ref 0–5)

## 2019-09-11 ENCOUNTER — Encounter: Payer: Self-pay | Admitting: Adult Health Nurse Practitioner
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
## 2019-12-23 ENCOUNTER — Encounter (HOSPITAL_COMMUNITY): Payer: Self-pay

## 2019-12-23 ENCOUNTER — Emergency Department (HOSPITAL_COMMUNITY)
Admission: EM | Admit: 2019-12-23 | Discharge: 2019-12-23 | Disposition: A | Discharge status: Home / Self Care | Payer: HRSA Program | Attending: Emergency Medicine | Admitting: Emergency Medicine

## 2019-12-23 ENCOUNTER — Emergency Department (HOSPITAL_COMMUNITY): Payer: HRSA Program

## 2019-12-23 DIAGNOSIS — Z87891 Personal history of nicotine dependence: Secondary | ICD-10-CM | POA: Insufficient documentation

## 2019-12-23 DIAGNOSIS — J45909 Unspecified asthma, uncomplicated: Secondary | ICD-10-CM | POA: Diagnosis not present

## 2019-12-23 DIAGNOSIS — Z7982 Long term (current) use of aspirin: Secondary | ICD-10-CM | POA: Diagnosis not present

## 2019-12-23 DIAGNOSIS — U071 COVID-19: Secondary | ICD-10-CM | POA: Insufficient documentation

## 2019-12-23 DIAGNOSIS — R05 Cough: Secondary | ICD-10-CM | POA: Diagnosis present

## 2019-12-23 LAB — CBC WITH DIFFERENTIAL/PLATELET
Abs Immature Granulocytes: 0.03 10*3/uL (ref 0.00–0.07)
Basophils Absolute: 0 10*3/uL (ref 0.0–0.1)
Basophils Relative: 0 %
Eosinophils Absolute: 0.2 10*3/uL (ref 0.0–0.5)
Eosinophils Relative: 3 %
HCT: 48.2 % — ABNORMAL HIGH (ref 36.0–46.0)
Hemoglobin: 15.7 g/dL — ABNORMAL HIGH (ref 12.0–15.0)
Immature Granulocytes: 1 %
Lymphocytes Relative: 12 %
Lymphs Abs: 0.6 10*3/uL — ABNORMAL LOW (ref 0.7–4.0)
MCH: 29.2 pg (ref 26.0–34.0)
MCHC: 32.6 g/dL (ref 30.0–36.0)
MCV: 89.8 fL (ref 80.0–100.0)
Monocytes Absolute: 0.3 10*3/uL (ref 0.1–1.0)
Monocytes Relative: 5 %
Neutro Abs: 4.3 10*3/uL (ref 1.7–7.7)
Neutrophils Relative %: 79 %
Platelets: 165 10*3/uL (ref 150–400)
RBC: 5.37 MIL/uL — ABNORMAL HIGH (ref 3.87–5.11)
RDW: 11.9 % (ref 11.5–15.5)
WBC: 5.4 10*3/uL (ref 4.0–10.5)
nRBC: 0 % (ref 0.0–0.2)

## 2019-12-23 LAB — COMPREHENSIVE METABOLIC PANEL
ALT: 22 U/L (ref 0–44)
AST: 38 U/L (ref 15–41)
Albumin: 4 g/dL (ref 3.5–5.0)
Alkaline Phosphatase: 34 U/L — ABNORMAL LOW (ref 38–126)
Anion gap: 13 (ref 5–15)
BUN: 11 mg/dL (ref 6–20)
CO2: 22 mmol/L (ref 22–32)
Calcium: 8.8 mg/dL — ABNORMAL LOW (ref 8.9–10.3)
Chloride: 99 mmol/L (ref 98–111)
Creatinine, Ser: 1.32 mg/dL — ABNORMAL HIGH (ref 0.44–1.00)
GFR calc Af Amer: >60 mL/min (ref >60)
GFR calc non Af Amer: 53 mL/min — ABNORMAL LOW (ref >60)
Glucose, Bld: 107 mg/dL — ABNORMAL HIGH (ref 70–99)
Potassium: 3.6 mmol/L (ref 3.5–5.1)
Sodium: 134 mmol/L — ABNORMAL LOW (ref 135–145)
Total Bilirubin: 1.1 mg/dL (ref 0.3–1.2)
Total Protein: 7.4 g/dL (ref 6.5–8.1)

## 2019-12-23 LAB — SARS CORONAVIRUS 2 BY RT PCR (HOSPITAL ORDER, PERFORMED IN ~~LOC~~ HOSPITAL LAB): SARS Coronavirus 2: POSITIVE — AB

## 2019-12-23 LAB — PREGNANCY, URINE: Preg Test, Ur: NEGATIVE

## 2019-12-23 MED ORDER — KETOROLAC TROMETHAMINE 15 MG/ML IJ SOLN
15.0000 mg | Freq: Once | INTRAMUSCULAR | Status: AC
  Administered 2019-12-23: 15 mg via INTRAVENOUS
  Filled 2019-12-23: qty 1

## 2019-12-23 MED ORDER — IBUPROFEN 400 MG PO TABS
600.0000 mg | ORAL_TABLET | Freq: Once | ORAL | Status: AC
  Administered 2019-12-23: 600 mg via ORAL
  Filled 2019-12-23: qty 1

## 2019-12-23 MED ORDER — ONDANSETRON HCL 4 MG/2ML IJ SOLN
4.0000 mg | Freq: Once | INTRAMUSCULAR | Status: AC
  Administered 2019-12-23: 4 mg via INTRAVENOUS
  Filled 2019-12-23: qty 2

## 2019-12-23 MED ORDER — ONDANSETRON HCL 4 MG PO TABS
4.0000 mg | ORAL_TABLET | Freq: Four times a day (QID) | ORAL | 0 refills | Status: DC | PRN
Start: 2019-12-23 — End: 2021-02-15

## 2019-12-23 MED ORDER — ALBUTEROL SULFATE (2.5 MG/3ML) 0.083% IN NEBU
5.0000 mg | INHALATION_SOLUTION | Freq: Once | RESPIRATORY_TRACT | Status: AC
  Administered 2019-12-23: 5 mg via RESPIRATORY_TRACT
  Filled 2019-12-23: qty 6

## 2019-12-23 MED ORDER — LOPERAMIDE HCL 2 MG PO CAPS
4.0000 mg | ORAL_CAPSULE | Freq: Once | ORAL | Status: AC
  Administered 2019-12-23: 4 mg via ORAL
  Filled 2019-12-23: qty 2

## 2019-12-23 MED ORDER — MORPHINE SULFATE (PF) 4 MG/ML IV SOLN
4.0000 mg | Freq: Once | INTRAVENOUS | Status: AC
  Administered 2019-12-23: 4 mg via INTRAVENOUS
  Filled 2019-12-23: qty 1

## 2019-12-23 MED ORDER — LACTATED RINGERS IV BOLUS
1000.0000 mL | Freq: Once | INTRAVENOUS | Status: AC
  Administered 2019-12-23: 1000 mL via INTRAVENOUS

## 2019-12-23 NOTE — ED Triage Notes (Signed)
Onset 3 days vomiting, diarrhea, cough, and shortness of breath.    Pt refusing to wear mask, states she was told by staff that she did not have to.  Advised that it is hospital policy that a mask be worn.   After few minutes pt put mask on.

## 2019-12-23 NOTE — ED Notes (Signed)
Pt is out of nebulizer medication at home.

## 2019-12-23 NOTE — ED Notes (Signed)
Patient verbalizes understanding of discharge instructions. Opportunity for questioning and answers were provided. Armband removed by staff, pt discharged from ED ambulatory.   

## 2019-12-24 ENCOUNTER — Telehealth: Payer: Self-pay | Admitting: Nurse Practitioner

## 2019-12-24 ENCOUNTER — Other Ambulatory Visit: Payer: Self-pay

## 2019-12-24 NOTE — Telephone Encounter (Signed)
Called to discuss with Jean Ali about Covid symptoms and the use of casirivimab/imdevimab, a combination monoclonal antibody infusion for those with mild to moderate Covid symptoms and at a high risk of hospitalization.     Pt is qualified for this infusion at the [facility] infusion center due to co-morbid conditions (BMI >25).   Per ED notation symptom onset 12/21/19 with vomiting, diarrhea, cough, and shortness of breath. Last eligible day is 12/30/19.   Unable to reach. Voicemail left an MyChart message sent.    Patient Active Problem List   Diagnosis Date Noted  . Preoperative clearance 07/27/2019  . 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 Traumatic tympanic membrane perforation, left, initial encounter 11/27/2018    Alda Lea, AGPCNP-BC

## 2019-12-24 NOTE — ED Provider Notes (Signed)
[facility] EMERGENCY DEPARTMENT Provider Note   CSN: [id number] Arrival date & time: 12/23/19  1531     History Chief Complaint  Patient presents with  . Cough    Jean Ali is a 34 y.o. female.  HPI   33ym F with fever, cough, diarrhea, chills and fatigue. Onset about three days ago. Persistent since. No sick contacts that she is aware of. Hx of asthma but says she hasn't had to use medications for it in years. No covid vaccination.   Past Medical History:  Diagnosis Date  . Asthma   . Depression    pp 2006 no meds  . Fibroid   . Hyperemesis gravidarum    Patient Active Problem List   Diagnosis Date Noted  . Preoperative clearance 07/27/2019  . 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 Traumatic tympanic membrane perforation, left, initial encounter 11/27/2018   Past Surgical History:  Procedure Laterality Date  . DILATION AND CURETTAGE OF UTERUS    . THERAPEUTIC ABORTION      OB History    Gravida  5   Para  2   Term  2   Preterm      AB  3   Living  2     SAB  1   TAB  2   Ectopic      Multiple      Live Births  2          Family History  Problem Relation Age of Onset  . Cancer Maternal Grandmother   . Asthma Sister   . Asthma Daughter   . Asthma Maternal Aunt   . Liver cancer Father   . Anesthesia problems Neg Hx   . Hypotension Neg Hx   . Malignant hyperthermia Neg Hx   . Pseudochol deficiency Neg Hx    Social History   Tobacco Use  . Smoking status: Former Smoker    Types: Cigars  . Smokeless tobacco: Never Used  . Tobacco comment: occasional Black and Mild  Vaping Use  . Vaping Use: Never used  Substance Use Topics  . Alcohol use: Yes    Comment: occasional  . Drug use: No    Home Medications Prior to Admission medications   Medication Sig Start Date End Date Taking? Authorizing Provider  acetaminophen (TYLENOL) 500 MG tablet Take 500 mg  by mouth every 6 (six) hours as needed for headache.    [provider]  aspirin-acetaminophen-caffeine (EXCEDRIN MIGRAINE) [phone] MG per tablet Take 2 tablets by mouth every 6 (six) hours as needed for migraine.    [provider]  medroxyPROGESTERone (DEPO-PROVERA) 150 MG/ML injection INJECT 1 MILLILITERS INTO THE MUSCLE ONCE  AS DIRECTED 10/20/17   Shelly Bombard, MD  ondansetron (ZOFRAN) 4 MG tablet Take 1 tablet (4 mg total) by mouth every 6 (six) hours as needed for nausea or vomiting. 12/23/19   Virgel Manifold, MD  Prenatal Vit-Fe Fumarate-FA (PNV PRENATAL PLUS MULTIVITAMIN) 27-1 MG TABS Take 1 tablet by mouth daily before breakfast. 10/20/17   Shelly Bombard, MD    Allergies    Acetaminophen and Penicillins  Review of Systems   Review of Systems All systems reviewed and negative, other than as noted in HPI.  Physical Exam Updated Vital Signs BP 99/69   Pulse 99   Temp (!) 100.4 F (38 C) (Oral)   Resp 15   SpO2 99%   Physical  Exam Vitals and nursing note reviewed.  Constitutional:      General: She is not in acute distress.    Appearance: She is well-developed.     Comments: Appears tired but not distressed  HENT:     Head: Normocephalic and atraumatic.  Eyes:     General:        Right eye: No discharge.        Left eye: No discharge.     Conjunctiva/sclera: Conjunctivae normal.  Cardiovascular:     Rate and Rhythm: Regular rhythm. Tachycardia present.     Heart sounds: Normal heart sounds. No murmur heard.  No friction rub. No gallop.   Pulmonary:     Effort: Pulmonary effort is normal. No respiratory distress.     Breath sounds: Normal breath sounds.  Abdominal:     General: There is no distension.     Palpations: Abdomen is soft.     Tenderness: There is no abdominal tenderness.  Musculoskeletal:        General: No tenderness.     Cervical back: Neck supple.  Skin:    General: Skin is warm and dry.  Neurological:     Mental Status:  She is alert.  Psychiatric:        Behavior: Behavior normal.        Thought Content: Thought content normal.    ED Results / Procedures / Treatments   Labs (all labs ordered are listed, but only abnormal results are displayed) Labs Reviewed  SARS CORONAVIRUS 2 BY RT PCR ([facility]) - Abnormal; Notable for the following components:      Result Value   SARS Coronavirus 2 POSITIVE (*)    All other components within normal limits  CBC WITH DIFFERENTIAL/PLATELET - Abnormal; Notable for the following components:   RBC 5.37 (*)    Hemoglobin 15.7 (*)    HCT 48.2 (*)    Lymphs Abs 0.6 (*)    All other components within normal limits  COMPREHENSIVE METABOLIC PANEL - Abnormal; Notable for the following components:   Sodium 134 (*)    Glucose, Bld 107 (*)    Creatinine, Ser 1.32 (*)    Calcium 8.8 (*)    Alkaline Phosphatase 34 (*)    GFR calc non Af Amer 53 (*)    All other components within normal limits  PREGNANCY, URINE   EKG None  Radiology DG Chest Portable 1 View  Result Date: 12/23/2019 CLINICAL DATA:  Cough EXAM: PORTABLE CHEST 1 VIEW COMPARISON:  07/26/2019 FINDINGS: The heart size and mediastinal contours are within normal limits. Both lungs are clear. The visualized skeletal structures are unremarkable. IMPRESSION: No active disease. Electronically Signed   By: Rolm Baptise M.D.   On: 12/23/2019 19:34   Procedures Procedures (including critical care time)  Medications Ordered in ED Medications  albuterol (PROVENTIL) (2.5 MG/3ML) 0.083% nebulizer solution 5 mg (5 mg Nebulization Given 12/23/19 1604)  lactated ringers bolus 1,000 mL (0 mLs Intravenous Stopped 12/23/19 2223)  ondansetron (ZOFRAN) injection 4 mg (4 mg Intravenous Given 12/23/19 1951)  ketorolac (TORADOL) 15 MG/ML injection 15 mg (15 mg Intravenous Given 12/23/19 1951)  loperamide (IMODIUM) capsule 4 mg (4 mg Oral Given 12/23/19 1951)  ibuprofen (ADVIL) tablet 600 mg (600  mg Oral Given 12/23/19 2133)  morphine 4 MG/ML injection 4 mg (4 mg Intravenous Given 12/23/19 2133)    ED Course  I have reviewed the triage vital signs and the  nursing notes.  Pertinent labs & imaging results that were available during my care of the patient were reviewed by me and considered in my medical decision making (see chart for details).    MDM Rules/Calculators/A&P                           33yF symptomatic with COVID. Fine from respiratory standpoint. Continued symptomatic tx. Return precautions discussed.   Jean Ali was evaluated in Emergency Department on 12/24/2019 for the symptoms described in the history of present illness. She was evaluated in the context of the global COVID-19 pandemic, which necessitated consideration that the patient might be at risk for infection with the SARS-CoV-2 virus that causes COVID-19. Institutional protocols and algorithms that pertain to the evaluation of patients at risk for COVID-19 are in a state of rapid change based on information released by regulatory bodies including the CDC and federal and state organizations. These policies and algorithms were followed during the patient's care in the ED.  Final Clinical Impression(s) / ED Diagnoses Final diagnoses:  COVID-19 virus infection    Rx / DC Orders ED Discharge Orders         Ordered    ondansetron (ZOFRAN) 4 MG tablet  Every 6 hours PRN     Discontinue  Reprint     12/23/19 2157           Virgel Manifold, MD 12/24/19 2035

## 2019-12-26 ENCOUNTER — Encounter (HOSPITAL_COMMUNITY): Payer: Self-pay | Admitting: *Deleted

## 2019-12-26 ENCOUNTER — Emergency Department (HOSPITAL_COMMUNITY): Payer: HRSA Program

## 2019-12-26 ENCOUNTER — Other Ambulatory Visit: Payer: Self-pay

## 2019-12-26 ENCOUNTER — Emergency Department (HOSPITAL_COMMUNITY)
Admission: EM | Admit: 2019-12-26 | Discharge: 2019-12-26 | Disposition: A | Discharge status: Home / Self Care | Payer: HRSA Program | Attending: Emergency Medicine | Admitting: Emergency Medicine

## 2019-12-26 DIAGNOSIS — J45909 Unspecified asthma, uncomplicated: Secondary | ICD-10-CM | POA: Diagnosis not present

## 2019-12-26 DIAGNOSIS — U071 COVID-19: Secondary | ICD-10-CM

## 2019-12-26 DIAGNOSIS — R197 Diarrhea, unspecified: Secondary | ICD-10-CM | POA: Insufficient documentation

## 2019-12-26 DIAGNOSIS — Z87891 Personal history of nicotine dependence: Secondary | ICD-10-CM | POA: Diagnosis not present

## 2019-12-26 DIAGNOSIS — J1282 Pneumonia due to coronavirus disease 2019: Secondary | ICD-10-CM | POA: Diagnosis not present

## 2019-12-26 DIAGNOSIS — R112 Nausea with vomiting, unspecified: Secondary | ICD-10-CM | POA: Diagnosis not present

## 2019-12-26 DIAGNOSIS — E860 Dehydration: Secondary | ICD-10-CM | POA: Diagnosis not present

## 2019-12-26 DIAGNOSIS — R509 Fever, unspecified: Secondary | ICD-10-CM | POA: Diagnosis present

## 2019-12-26 LAB — COMPREHENSIVE METABOLIC PANEL
ALT: 27 U/L (ref 0–44)
AST: 71 U/L — ABNORMAL HIGH (ref 15–41)
Albumin: 3.5 g/dL (ref 3.5–5.0)
Alkaline Phosphatase: 32 U/L — ABNORMAL LOW (ref 38–126)
Anion gap: 14 (ref 5–15)
BUN: 10 mg/dL (ref 6–20)
CO2: 20 mmol/L — ABNORMAL LOW (ref 22–32)
Calcium: 8.7 mg/dL — ABNORMAL LOW (ref 8.9–10.3)
Chloride: 99 mmol/L (ref 98–111)
Creatinine, Ser: 1.16 mg/dL — ABNORMAL HIGH (ref 0.44–1.00)
GFR calc Af Amer: >60 mL/min (ref >60)
GFR calc non Af Amer: >60 mL/min (ref >60)
Glucose, Bld: 132 mg/dL — ABNORMAL HIGH (ref 70–99)
Potassium: 3.8 mmol/L (ref 3.5–5.1)
Sodium: 133 mmol/L — ABNORMAL LOW (ref 135–145)
Total Bilirubin: 0.4 mg/dL (ref 0.3–1.2)
Total Protein: 7.1 g/dL (ref 6.5–8.1)

## 2019-12-26 LAB — CBC
HCT: 48 % — ABNORMAL HIGH (ref 36.0–46.0)
Hemoglobin: 15.8 g/dL — ABNORMAL HIGH (ref 12.0–15.0)
MCH: 28.9 pg (ref 26.0–34.0)
MCHC: 32.9 g/dL (ref 30.0–36.0)
MCV: 87.9 fL (ref 80.0–100.0)
Platelets: 166 10*3/uL (ref 150–400)
RBC: 5.46 MIL/uL — ABNORMAL HIGH (ref 3.87–5.11)
RDW: 12.1 % (ref 11.5–15.5)
WBC: 6.4 10*3/uL (ref 4.0–10.5)
nRBC: 0 % (ref 0.0–0.2)

## 2019-12-26 LAB — I-STAT BETA HCG BLOOD, ED (MC, WL, AP ONLY): I-stat hCG, quantitative: <5 m[IU]/mL (ref <5)

## 2019-12-26 LAB — LIPASE, BLOOD: Lipase: 59 U/L — ABNORMAL HIGH (ref 11–51)

## 2019-12-26 MED ORDER — EPINEPHRINE 0.3 MG/0.3ML IJ SOAJ: 0.3000 mg | Freq: Once | INTRAMUSCULAR | Status: DC | PRN

## 2019-12-26 MED ORDER — PROMETHAZINE HCL 25 MG/ML IJ SOLN
12.5000 mg | Freq: Once | INTRAMUSCULAR | Status: AC
  Administered 2019-12-26: 12.5 mg via INTRAVENOUS
  Filled 2019-12-26: qty 1

## 2019-12-26 MED ORDER — PROMETHAZINE HCL 25 MG PO TABS
25.0000 mg | ORAL_TABLET | Freq: Four times a day (QID) | ORAL | 0 refills | Status: DC | PRN
Start: 2019-12-26 — End: 2021-02-15

## 2019-12-26 MED ORDER — ACETAMINOPHEN 325 MG PO TABS
650.0000 mg | ORAL_TABLET | Freq: Once | ORAL | Status: AC | PRN
  Administered 2019-12-26: 650 mg via ORAL
  Filled 2019-12-26: qty 2

## 2019-12-26 MED ORDER — DIPHENHYDRAMINE HCL 50 MG/ML IJ SOLN: 50.0000 mg | Freq: Once | INTRAMUSCULAR | Status: DC | PRN

## 2019-12-26 MED ORDER — ALBUTEROL SULFATE HFA 108 (90 BASE) MCG/ACT IN AERS: 2.0000 | INHALATION_SPRAY | Freq: Once | RESPIRATORY_TRACT | Status: DC | PRN

## 2019-12-26 MED ORDER — FAMOTIDINE IN NACL 20-0.9 MG/50ML-% IV SOLN: 20.0000 mg | Freq: Once | INTRAVENOUS | Status: DC | PRN

## 2019-12-26 MED ORDER — BENZONATATE 100 MG PO CAPS
100.0000 mg | ORAL_CAPSULE | Freq: Three times a day (TID) | ORAL | 0 refills | Status: DC
Start: 2019-12-26 — End: 2021-02-15

## 2019-12-26 MED ORDER — ONDANSETRON 4 MG PO TBDP
4.0000 mg | ORAL_TABLET | Freq: Once | ORAL | Status: AC | PRN
  Administered 2019-12-26: 4 mg via ORAL
  Filled 2019-12-26: qty 1

## 2019-12-26 MED ORDER — SODIUM CHLORIDE 0.9 % IV BOLUS
1000.0000 mL | Freq: Once | INTRAVENOUS | Status: AC
  Administered 2019-12-26: 1000 mL via INTRAVENOUS

## 2019-12-26 MED ORDER — KETOROLAC TROMETHAMINE 15 MG/ML IJ SOLN
15.0000 mg | Freq: Once | INTRAMUSCULAR | Status: AC
  Administered 2019-12-26: 15 mg via INTRAVENOUS
  Filled 2019-12-26: qty 1

## 2019-12-26 MED ORDER — SODIUM CHLORIDE 0.9 % IV SOLN: INTRAVENOUS | Status: DC | PRN

## 2019-12-26 MED ORDER — SODIUM CHLORIDE 0.9 % IV SOLN
1200.0000 mg | Freq: Once | INTRAVENOUS | Status: AC
  Administered 2019-12-26: 1200 mg via INTRAVENOUS
  Filled 2019-12-26: qty 10

## 2019-12-26 MED ORDER — METHYLPREDNISOLONE SODIUM SUCC 125 MG IJ SOLR: 125.0000 mg | Freq: Once | INTRAMUSCULAR | Status: DC | PRN

## 2019-12-26 NOTE — ED Provider Notes (Addendum)
[facility] EMERGENCY DEPARTMENT Provider Note   CSN: [id number] Arrival date & time: 12/26/19  0846     History Chief Complaint  Patient presents with  . Emesis  . Diarrhea  . Shortness of Breath  . Covid Exposure    Jean Ali is a 34 y.o. female with history of asthma who presents with worsening COVID symptoms. She states that symptoms started a week ago but she was diagnosed 3 days ago. She reports fever, chills, body/joint aches, coughing, SOB, N/V/D. The body pain is worse with coughing. She's tried multiple OTC meds without relief. She was contacted by the [facility] but states she's been too weak and fatigued to interact with anyone.   HPI     Past Medical History:  Diagnosis Date  . Asthma   . Depression    pp 2006 no meds  . Fibroid   . Hyperemesis gravidarum     Patient Active Problem List   Diagnosis Date Noted  . Preoperative clearance 07/27/2019  . 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 Traumatic tympanic membrane perforation, left, initial encounter 11/27/2018    Past Surgical History:  Procedure Laterality Date  . DILATION AND CURETTAGE OF UTERUS    . THERAPEUTIC ABORTION       OB History    Gravida  5   Para  2   Term  2   Preterm      AB  3   Living  2     SAB  1   TAB  2   Ectopic      Multiple      Live Births  2           Family History  Problem Relation Age of Onset  . Cancer Maternal Grandmother   . Asthma Sister   . Asthma Daughter   . Asthma Maternal Aunt   . Liver cancer Father   . Anesthesia problems Neg Hx   . Hypotension Neg Hx   . Malignant hyperthermia Neg Hx   . Pseudochol deficiency Neg Hx     Social History   Tobacco Use  . Smoking status: Former Smoker    Types: Cigars  . Smokeless tobacco: Never Used  . Tobacco comment: occasional Black and Mild  Vaping Use  . Vaping Use: Never used  Substance Use Topics    . Alcohol use: Yes    Comment: occasional  . Drug use: No    Home Medications Prior to Admission medications   Medication Sig Start Date End Date Taking? Authorizing Provider  acetaminophen (TYLENOL) 500 MG tablet Take 500 mg by mouth every 6 (six) hours as needed for headache.    [provider]  aspirin-acetaminophen-caffeine (EXCEDRIN MIGRAINE) [phone] MG per tablet Take 2 tablets by mouth every 6 (six) hours as needed for migraine.    [provider]  medroxyPROGESTERone (DEPO-PROVERA) 150 MG/ML injection INJECT 1 MILLILITERS INTO THE MUSCLE ONCE  AS DIRECTED 10/20/17   Shelly Bombard, MD  ondansetron (ZOFRAN) 4 MG tablet Take 1 tablet (4 mg total) by mouth every 6 (six) hours as needed for nausea or vomiting. 12/23/19   Virgel Manifold, MD  Prenatal Vit-Fe Fumarate-FA (PNV PRENATAL PLUS MULTIVITAMIN) 27-1 MG TABS Take 1 tablet by mouth daily before breakfast. 10/20/17   Shelly Bombard, MD    Allergies    Acetaminophen and Penicillins  Review of Systems   Review of  Systems  Constitutional: Positive for activity change, appetite change, chills, fatigue and fever.  Respiratory: Positive for cough and shortness of breath.   Cardiovascular: Negative for chest pain.  Gastrointestinal: Positive for diarrhea, nausea and vomiting. Negative for abdominal pain.  All other systems reviewed and are negative.   Physical Exam Updated Vital Signs BP 110/68   Pulse 95   Temp 100 F (37.8 C) (Oral)   Resp (!) 25   Ht 5\' 5"  (1.651 m)   SpO2 97%   BMI 27.49 kg/m   Physical Exam Vitals and nursing note reviewed.  Constitutional:      General: She is not in acute distress.    Appearance: Normal appearance. She is well-developed. She is not ill-appearing.     Comments: Uncomfortable appearing. NAD  HENT:     Head: Normocephalic and atraumatic.  Eyes:     General: No scleral icterus.       Right eye: No discharge.        Left eye: No discharge.      Conjunctiva/sclera: Conjunctivae normal.     Pupils: Pupils are equal, round, and reactive to light.  Cardiovascular:     Rate and Rhythm: Normal rate and regular rhythm.  Pulmonary:     Effort: Pulmonary effort is normal. No respiratory distress.     Breath sounds: Normal breath sounds.  Abdominal:     General: There is no distension.     Palpations: Abdomen is soft.     Tenderness: There is no abdominal tenderness.  Musculoskeletal:     Cervical back: Normal range of motion.  Skin:    General: Skin is warm and dry.  Neurological:     Mental Status: She is alert and oriented to person, place, and time.  Psychiatric:        Behavior: Behavior normal.     ED Results / Procedures / Treatments   Labs (all labs ordered are listed, but only abnormal results are displayed) Labs Reviewed  LIPASE, BLOOD - Abnormal; Notable for the following components:      Result Value   Lipase 59 (*)    All other components within normal limits  COMPREHENSIVE METABOLIC PANEL - Abnormal; Notable for the following components:   Sodium 133 (*)    CO2 20 (*)    Glucose, Bld 132 (*)    Creatinine, Ser 1.16 (*)    Calcium 8.7 (*)    AST 71 (*)    Alkaline Phosphatase 32 (*)    All other components within normal limits  CBC - Abnormal; Notable for the following components:   RBC 5.46 (*)    Hemoglobin 15.8 (*)    HCT 48.0 (*)    All other components within normal limits  I-STAT BETA HCG BLOOD, ED ([facility], [facility], [facility] ONLY)    EKG None  Radiology No results found.  Procedures Procedures (including critical care time)  Medications Ordered in ED Medications  0.9 %  sodium chloride infusion (has no administration in time range)  diphenhydrAMINE (BENADRYL) injection 50 mg (has no administration in time range)  famotidine (PEPCID) IVPB 20 mg premix (has no administration in time range)  methylPREDNISolone sodium succinate (SOLU-MEDROL) 125 mg/2 mL injection 125 mg (has no administration in time  range)  albuterol (VENTOLIN HFA) 108 (90 Base) MCG/ACT inhaler 2 puff (has no administration in time range)  EPINEPHrine (EPI-PEN) injection 0.3 mg (has no administration in time range)  ondansetron (ZOFRAN-ODT) disintegrating tablet 4 mg (4 mg Oral  Given 12/26/19 0859)  acetaminophen (TYLENOL) tablet 650 mg (650 mg Oral Given 12/26/19 0859)  sodium chloride 0.9 % bolus 1,000 mL (0 mLs Intravenous Stopped 12/26/19 1444)  promethazine (PHENERGAN) injection 12.5 mg (12.5 mg Intravenous Given 12/26/19 1317)  ketorolac (TORADOL) 15 MG/ML injection 15 mg (15 mg Intravenous Given 12/26/19 1317)  casirivimab-imdevimab (REGEN-COV) 1,200 mg in sodium chloride 0.9 % 110 mL IVPB (0 mg Intravenous Stopped 12/26/19 1500)    ED Course  I have reviewed the triage vital signs and the nursing notes.  Pertinent labs & imaging results that were available during my care of the patient were reviewed by me and considered in my medical decision making (see chart for details).  34 year old female presents with worsening COVID symptoms. She is about one week in to her illness. She reports multiple symptoms but primarily weakness, fatigue, pain with coughing, and not eating or drinking. She is febrile but otherwise vitals are normal. Labs are consistent with mild-moderate dehydration. Will obtain CXR for complaints of SOB/cough. Will give fluids, nausea meds, toradol. Pt qualifies for MAB due to her BMI and is agreeable to an infusion today.  CXR shows developing pneumonia however her sats are normal and she is not in respiratory distress. She is currently receiving MAB and will be monitored for 1 hour post-infusion. She was advised that she will be discharged after that and verbalized understanding. Will rx Phenergan and Tessalon for her symptoms. Discussed return precautions.  MDM Rules/Calculators/A&P                           Final Clinical Impression(s) / ED Diagnoses Final diagnoses:  COVID-19  Nausea vomiting and  diarrhea  Dehydration  Pneumonia due to COVID-19 virus    Rx / DC Orders ED Discharge Orders    None       Recardo Evangelist, PA-C 12/26/19 1600    Recardo Evangelist, PA-C 12/26/19 1606    Sherwood Gambler, MD 12/26/19 1627

## 2019-12-26 NOTE — ED Triage Notes (Signed)
To ED for further eval of n/v/d/sob - dx with covid 2 days ago. Pt moaning in triage. States she hasn't eaten in 6 day. Pt is unvaccinated. Fever noted in triage. Last Tylenol dose yesterday.

## 2019-12-26 NOTE — Discharge Instructions (Addendum)
Take Phenergan as needed for nausea and vomiting Please rest and stay hydrated Take Ibuprofen and Tylenol for pain  Take Tessalon for cough Return if you continue to have worsening symptoms Please continue to quarantine until it's been 10 days since your symptoms have started and you have been without a fever for 24 hours without taking Tylenol or Ibuprofen

## 2019-12-30 ENCOUNTER — Emergency Department (HOSPITAL_COMMUNITY): Payer: HRSA Program

## 2019-12-30 ENCOUNTER — Emergency Department (HOSPITAL_COMMUNITY)
Admission: EM | Admit: 2019-12-30 | Discharge: 2019-12-30 | Disposition: A | Discharge status: Home / Self Care | Payer: HRSA Program | Attending: Emergency Medicine | Admitting: Emergency Medicine

## 2019-12-30 ENCOUNTER — Other Ambulatory Visit: Payer: Self-pay

## 2019-12-30 ENCOUNTER — Encounter (HOSPITAL_COMMUNITY): Payer: Self-pay

## 2019-12-30 DIAGNOSIS — J45909 Unspecified asthma, uncomplicated: Secondary | ICD-10-CM | POA: Diagnosis not present

## 2019-12-30 DIAGNOSIS — R0602 Shortness of breath: Secondary | ICD-10-CM | POA: Diagnosis present

## 2019-12-30 DIAGNOSIS — U071 COVID-19: Secondary | ICD-10-CM | POA: Diagnosis not present

## 2019-12-30 DIAGNOSIS — Z87891 Personal history of nicotine dependence: Secondary | ICD-10-CM | POA: Insufficient documentation

## 2019-12-30 LAB — COMPREHENSIVE METABOLIC PANEL
ALT: 33 U/L (ref 0–44)
AST: 65 U/L — ABNORMAL HIGH (ref 15–41)
Albumin: 3.6 g/dL (ref 3.5–5.0)
Alkaline Phosphatase: 33 U/L — ABNORMAL LOW (ref 38–126)
Anion gap: 12 (ref 5–15)
BUN: 15 mg/dL (ref 6–20)
CO2: 22 mmol/L (ref 22–32)
Calcium: 8.8 mg/dL — ABNORMAL LOW (ref 8.9–10.3)
Chloride: 101 mmol/L (ref 98–111)
Creatinine, Ser: 0.78 mg/dL (ref 0.44–1.00)
GFR calc Af Amer: >60 mL/min (ref >60)
GFR calc non Af Amer: >60 mL/min (ref >60)
Glucose, Bld: 102 mg/dL — ABNORMAL HIGH (ref 70–99)
Potassium: 4 mmol/L (ref 3.5–5.1)
Sodium: 135 mmol/L (ref 135–145)
Total Bilirubin: 0.9 mg/dL (ref 0.3–1.2)
Total Protein: 8 g/dL (ref 6.5–8.1)

## 2019-12-30 LAB — CBC WITH DIFFERENTIAL/PLATELET
Abs Immature Granulocytes: 0.65 10*3/uL — ABNORMAL HIGH (ref 0.00–0.07)
Basophils Absolute: 0.1 10*3/uL (ref 0.0–0.1)
Basophils Relative: 2 %
Eosinophils Absolute: 0 10*3/uL (ref 0.0–0.5)
Eosinophils Relative: 0 %
HCT: 42.4 % (ref 36.0–46.0)
Hemoglobin: 13.8 g/dL (ref 12.0–15.0)
Immature Granulocytes: 8 %
Lymphocytes Relative: 20 %
Lymphs Abs: 1.7 10*3/uL (ref 0.7–4.0)
MCH: 29.6 pg (ref 26.0–34.0)
MCHC: 32.5 g/dL (ref 30.0–36.0)
MCV: 90.8 fL (ref 80.0–100.0)
Monocytes Absolute: 0.8 10*3/uL (ref 0.1–1.0)
Monocytes Relative: 10 %
Neutro Abs: 5.2 10*3/uL (ref 1.7–7.7)
Neutrophils Relative %: 60 %
Platelets: 293 10*3/uL (ref 150–400)
RBC: 4.67 MIL/uL (ref 3.87–5.11)
RDW: 12.2 % (ref 11.5–15.5)
WBC: 8.6 10*3/uL (ref 4.0–10.5)
nRBC: 0 % (ref 0.0–0.2)

## 2019-12-30 LAB — D-DIMER, QUANTITATIVE: D-Dimer, Quant: 1.07 ug/mL-FEU — ABNORMAL HIGH (ref 0.00–0.50)

## 2019-12-30 LAB — FERRITIN: Ferritin: 896 ng/mL — ABNORMAL HIGH (ref 11–307)

## 2019-12-30 LAB — TRIGLYCERIDES: Triglycerides: 128 mg/dL (ref <150)

## 2019-12-30 LAB — C-REACTIVE PROTEIN: CRP: 2.7 mg/dL — ABNORMAL HIGH (ref <1.0)

## 2019-12-30 LAB — LACTIC ACID, PLASMA
Lactic Acid, Venous: 1.1 mmol/L (ref 0.5–1.9)
Lactic Acid, Venous: 1 mmol/L (ref 0.5–1.9)

## 2019-12-30 LAB — PROCALCITONIN: Procalcitonin: <0.1 ng/mL

## 2019-12-30 LAB — FIBRINOGEN: Fibrinogen: >800 mg/dL — ABNORMAL HIGH (ref 210–475)

## 2019-12-30 LAB — I-STAT BETA HCG BLOOD, ED (NOT ORDERABLE): I-stat hCG, quantitative: <5 m[IU]/mL (ref <5)

## 2019-12-30 LAB — LACTATE DEHYDROGENASE: LDH: 412 U/L — ABNORMAL HIGH (ref 98–192)

## 2019-12-30 MED ORDER — NAPROXEN 375 MG PO TABS
375.0000 mg | ORAL_TABLET | Freq: Two times a day (BID) | ORAL | 0 refills | Status: DC
Start: 2019-12-30 — End: 2021-02-15

## 2019-12-30 MED ORDER — SODIUM CHLORIDE (PF) 0.9 % IJ SOLN
INTRAMUSCULAR | Status: AC
  Filled 2019-12-30: qty 50

## 2019-12-30 MED ORDER — FENTANYL CITRATE (PF) 100 MCG/2ML IJ SOLN
50.0000 ug | Freq: Once | INTRAMUSCULAR | Status: AC
  Administered 2019-12-30: 50 ug via INTRAVENOUS
  Filled 2019-12-30: qty 2

## 2019-12-30 MED ORDER — ALBUTEROL SULFATE HFA 108 (90 BASE) MCG/ACT IN AERS
2.0000 | INHALATION_SPRAY | RESPIRATORY_TRACT | Status: AC
  Administered 2019-12-30 (×3): 2 via RESPIRATORY_TRACT
  Filled 2019-12-30: qty 6.7

## 2019-12-30 MED ORDER — OXYCODONE-ACETAMINOPHEN 5-325 MG PO TABS
1.0000 | ORAL_TABLET | Freq: Once | ORAL | Status: AC
  Administered 2019-12-30: 1 via ORAL
  Filled 2019-12-30: qty 1

## 2019-12-30 MED ORDER — SODIUM CHLORIDE 0.9 % IV SOLN
1000.0000 mL | INTRAVENOUS | Status: DC
  Administered 2019-12-30: 1000 mL via INTRAVENOUS

## 2019-12-30 MED ORDER — ONDANSETRON 8 MG PO TBDP
8.0000 mg | ORAL_TABLET | Freq: Three times a day (TID) | ORAL | 0 refills | Status: DC | PRN
Start: 2019-12-30 — End: 2021-02-15

## 2019-12-30 MED ORDER — ASPIRIN EC 81 MG PO TBEC
81.0000 mg | DELAYED_RELEASE_TABLET | Freq: Once | ORAL | Status: AC
  Administered 2019-12-30: 81 mg via ORAL
  Filled 2019-12-30: qty 1

## 2019-12-30 MED ORDER — IOHEXOL 350 MG/ML SOLN
100.0000 mL | Freq: Once | INTRAVENOUS | Status: AC | PRN
  Administered 2019-12-30: 100 mL via INTRAVENOUS

## 2019-12-30 NOTE — ED Notes (Signed)
Pt ambulated around room with portable pulse ox and oxygen levels stayed at 98-100% while heart rate remained in the 90s.   MD made aware.  Patient provided female urinal upon request.

## 2019-12-30 NOTE — ED Triage Notes (Signed)
Pt presents with c/o shortness of breath. Pt diagnosed with Covid one week ago and reports she has been seen multiple times since the diagnosis and her breathing is not getting any better. Pt has some tachypnea noted in triage, no further distress noted.

## 2019-12-30 NOTE — ED Notes (Signed)
IV placed, blood cultures and lactic acid obtained. Medicated as ordered. C/o back pain. Offers no other complaints at this time. Call bell within reach.

## 2019-12-30 NOTE — ED Provider Notes (Addendum)
[facility] DEPT Provider Note   CSN: [id number] Arrival date & time: 12/30/19  1124     History Chief Complaint  Patient presents with  . Covid positive  . Shortness of Breath    Jean Ali is a 34 y.o. female.  HPI     34 year old female comes in a chief complaint of shortness of breath.  Patient has COVID-19, that was diagnosed on 8-7.  At that time she had come in with 3-day episode of shortness of breath and flulike symptoms.  Patient reports that over the past few days her symptoms have worsened.  She still feels short of breath and it is not getting better.  Patient has some chest discomfort when she breathes in.  She has persistent cough that is still mostly nonproductive.  She has subjective fevers.  Review of system is negative for any vomiting, diarrhea.  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  She is unvaccinated.  It appears that she has not received monoclonal antibody.  Past Medical History:  Diagnosis Date  . Asthma   . Depression    pp 2006 no meds  . Fibroid   . Hyperemesis gravidarum     Patient Active Problem List   Diagnosis Date Noted  . Preoperative clearance 07/27/2019  . 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 Traumatic tympanic membrane perforation, left, initial encounter 11/27/2018    Past Surgical History:  Procedure Laterality Date  . DILATION AND CURETTAGE OF UTERUS    . THERAPEUTIC ABORTION       OB History    Gravida  5   Para  2   Term  2   Preterm      AB  3   Living  2     SAB  1   TAB  2   Ectopic      Multiple      Live Births  2           Family History  Problem Relation Age of Onset  . Cancer Maternal Grandmother   . Asthma Sister   . Asthma Daughter   . Asthma Maternal Aunt   . Liver cancer Father   . Anesthesia problems Neg Hx   . Hypotension Neg Hx   . Malignant hyperthermia Neg Hx    . Pseudochol deficiency Neg Hx     Social History   Tobacco Use  . Smoking status: Former Smoker    Types: Cigars  . Smokeless tobacco: Never Used  . Tobacco comment: occasional Black and Mild  Vaping Use  . Vaping Use: Never used  Substance Use Topics  . Alcohol use: Yes    Comment: occasional  . Drug use: No    Home Medications Prior to Admission medications   Medication Sig Start Date End Date Taking? Authorizing Provider  benzonatate (TESSALON) 100 MG capsule Take 1 capsule (100 mg total) by mouth every 8 (eight) hours. 12/26/19  Yes Recardo Evangelist, PA-C  promethazine (PHENERGAN) 25 MG tablet Take 1 tablet (25 mg total) by mouth every 6 (six) hours as needed for nausea or vomiting. 12/26/19  Yes Recardo Evangelist, PA-C  guaiFENesin (MUCINEX) 600 MG 12 hr tablet Take 600 mg by mouth 2 (two) times daily as needed for cough.    [provider]  medroxyPROGESTERone (DEPO-PROVERA) 150 MG/ML injection INJECT 1 MILLILITERS INTO THE MUSCLE ONCE  AS DIRECTED  Patient taking differently: Inject 150 mg into the muscle every 3 (three) months. INJECT 1 MILLILITERS INTO THE MUSCLE ONCE  AS DIRECTED 10/20/17   Shelly Bombard, MD  MELATONIN PO Take 1 tablet by mouth at bedtime as needed (sleep).    [provider]  naproxen (NAPROSYN) 375 MG tablet Take 1 tablet (375 mg total) by mouth 2 (two) times daily. 12/30/19   Varney Biles, MD  ondansetron (ZOFRAN ODT) 8 MG disintegrating tablet Take 1 tablet (8 mg total) by mouth every 8 (eight) hours as needed for nausea. 12/30/19   Varney Biles, MD  ondansetron (ZOFRAN) 4 MG tablet Take 1 tablet (4 mg total) by mouth every 6 (six) hours as needed for nausea or vomiting. 12/23/19   Virgel Manifold, MD  Prenatal Vit-Fe Fumarate-FA (PNV PRENATAL PLUS MULTIVITAMIN) 27-1 MG TABS Take 1 tablet by mouth daily before breakfast. Patient not taking: Reported on 12/26/2019 10/20/17   Shelly Bombard, MD    Allergies    Acetaminophen and  Penicillins  Review of Systems   Review of Systems  Constitutional: Positive for activity change.  Respiratory: Positive for cough, chest tightness and shortness of breath.   Cardiovascular: Positive for chest pain.  Gastrointestinal: Negative for nausea and vomiting.  Skin: Negative for rash.  Allergic/Immunologic: Negative for immunocompromised state.  All other systems reviewed and are negative.   Physical Exam Updated Vital Signs BP 122/79   Pulse 95   Temp 98.9 F (37.2 C) (Oral)   Resp 20   Ht 5\' 5"  (1.651 m)   Wt 75.3 kg   LMP 12/30/2019   SpO2 100%   BMI 27.62 kg/m   Physical Exam Vitals and nursing note reviewed.  Constitutional:      Appearance: She is well-developed.  HENT:     Head: Normocephalic and atraumatic.  Cardiovascular:     Rate and Rhythm: Normal rate.  Pulmonary:     Effort: Pulmonary effort is normal. No accessory muscle usage.     Breath sounds: No decreased breath sounds, wheezing, rhonchi or rales.  Abdominal:     General: Bowel sounds are normal.  Musculoskeletal:     Cervical back: Normal range of motion and neck supple.     Right lower leg: No tenderness. No edema.     Left lower leg: No tenderness. No edema.  Skin:    General: Skin is warm and dry.  Neurological:     Mental Status: She is alert and oriented to person, place, and time.     ED Results / Procedures / Treatments   Labs (all labs ordered are listed, but only abnormal results are displayed) Labs Reviewed  CBC WITH DIFFERENTIAL/PLATELET - Abnormal; Notable for the following components:      Result Value   Abs Immature Granulocytes 0.65 (*)    All other components within normal limits  COMPREHENSIVE METABOLIC PANEL - Abnormal; Notable for the following components:   Glucose, Bld 102 (*)    Calcium 8.8 (*)    AST 65 (*)    Alkaline Phosphatase 33 (*)    All other components within normal limits  D-DIMER, QUANTITATIVE (NOT AT [facility]) - Abnormal; Notable for the  following components:   D-Dimer, Quant 1.07 (*)    All other components within normal limits  LACTATE DEHYDROGENASE - Abnormal; Notable for the following components:   LDH 412 (*)    All other components within normal limits  FERRITIN - Abnormal; Notable for the following components:  Ferritin 896 (*)    All other components within normal limits  FIBRINOGEN - Abnormal; Notable for the following components:   Fibrinogen >800 (*)    All other components within normal limits  C-REACTIVE PROTEIN - Abnormal; Notable for the following components:   CRP 2.7 (*)    All other components within normal limits  CULTURE, BLOOD (ROUTINE X 2)  CULTURE, BLOOD (ROUTINE X 2)  LACTIC ACID, PLASMA  LACTIC ACID, PLASMA  PROCALCITONIN  TRIGLYCERIDES  I-STAT BETA HCG BLOOD, ED ([facility], [facility], [facility] ONLY)  I-STAT BETA HCG BLOOD, ED (NOT ORDERABLE)    EKG EKG Interpretation  Date/Time:  Sunday December 30 2019 12:24:12 EDT Ventricular Rate:  92 PR Interval:    QRS Duration: 88 QT Interval:  367 QTC Calculation: 454 R Axis:   40 Text Interpretation: Sinus rhythm Borderline short PR interval RSR' in V1 or V2, more pronounced No acute changes No significant change since last tracing Confirmed by Varney Biles [phone]) on 12/30/2019 4:25:06 PM   Radiology DG Chest 2 View  Result Date: 12/30/2019 CLINICAL DATA:  Shortness of breath.  COVID-19 positive 1 week ago. EXAM: CHEST - 2 VIEW COMPARISON:  12/26/2019 FINDINGS: Lungs are hypoinflated with interval worsening hazy bilateral patchy airspace process likely multifocal pneumonia. No definite effusion. Cardiomediastinal silhouette and remainder of the exam is unchanged. IMPRESSION: Interval worsening hazy bilateral patchy airspace process likely multifocal pneumonia. Electronically Signed   By: Marin Olp M.D.   On: 12/30/2019 13:04   CT Angio Chest PE W and/or Wo Contrast  Result Date: 12/30/2019 CLINICAL DATA:  Shortness of breath.  COVID positive. EXAM: CT  ANGIOGRAPHY CHEST WITH CONTRAST TECHNIQUE: Multidetector CT imaging of the chest was performed using the standard protocol during bolus administration of intravenous contrast. Multiplanar CT image reconstructions and MIPs were obtained to evaluate the vascular anatomy. CONTRAST:  1109mL OMNIPAQUE IOHEXOL 350 MG/ML SOLN COMPARISON:  12/30/2019 chest radiograph.  No prior CT. FINDINGS: Cardiovascular: The quality of this exam for evaluation of pulmonary embolism is moderate. The bolus is mildly suboptimally timed, with contrast centered in the SVC. There is also mild motion degradation inferiorly. No pulmonary embolism to the large segmental level. Normal aortic caliber. Normal heart size, without pericardial effusion. Mediastinum/Nodes: No mediastinal or hilar adenopathy. Lungs/Pleura: No pleural fluid. Peripheral and basilar predominant ground-glass and airspace opacities, consistent with COVID-19 pneumonia. Upper Abdomen: Normal imaged portions of the liver, spleen, stomach, pancreas, adrenal glands, kidneys. Musculoskeletal: No acute osseous abnormality. Review of the MIP images confirms the above findings. IMPRESSION: 1. Moderate quality exam for evaluation of pulmonary embolism. No pulmonary embolism with limitations above. 2. Multifocal ground-glass and airspace opacities, consistent with COVID-19 pneumonia. Electronically Signed   By: Abigail Miyamoto M.D.   On: 12/30/2019 18:43    Procedures .Critical Care Performed by: Varney Biles, MD Authorized by: Varney Biles, MD   Critical care provider statement:    Critical care time (minutes):  32   Critical care was time spent personally by me on the following activities:  Discussions with consultants, evaluation of patient's response to treatment, examination of patient, ordering and performing treatments and interventions, ordering and review of laboratory studies, ordering and review of radiographic studies, pulse oximetry, re-evaluation of patient's  condition, obtaining history from patient or surrogate and review of old charts   (including critical care time)  Medications Ordered in ED Medications  0.9 %  sodium chloride infusion (1,000 mLs Intravenous New Bag/Given 12/30/19 1750)  albuterol (VENTOLIN HFA) 108 (  90 Base) MCG/ACT inhaler 2 puff (2 puffs Inhalation Given 12/30/19 1845)  aspirin EC tablet 81 mg (81 mg Oral Given 12/30/19 1751)  fentaNYL (SUBLIMAZE) injection 50 mcg (50 mcg Intravenous Given 12/30/19 1751)  sodium chloride (PF) 0.9 % injection (  Given by Other 12/30/19 1854)  iohexol (OMNIPAQUE) 350 MG/ML injection 100 mL (100 mLs Intravenous Contrast Given 12/30/19 1824)  oxyCODONE-acetaminophen (PERCOCET/ROXICET) 5-325 MG per tablet 1 tablet (1 tablet Oral Given 12/30/19 1944)    ED Course  I have reviewed the triage vital signs and the nursing notes.  Pertinent labs & imaging results that were available during my care of the patient were reviewed by me and considered in my medical decision making (see chart for details).  Clinical Course as of Dec 30 1532  Mon Dec 31, 2019  1531 I have received a message from hospital liaison with hospitalist home program.  Dr. Delane Ginger will notify the correct party to get in touch with the patient.   [AN]    Clinical Course User Index [AN] Varney Biles, MD   MDM Rules/Calculators/A&P                          34 year old female with known COVID-19 comes in a chief complaint of shortness of breath and chest tightness.  Her work-up is reassuring. On exam there is mild rhonchi, no wheezing.  Chest x-ray shows worsening pneumonia compared to the x-ray from 3 days back.  There is no respiratory distress.  Patient has respiratory rate between 19 and 22 during my assessment.  She is able to complete full sentences without having to stop.  She has history of DVT and reports some chest discomfort.  CT PE ordered and it is negative.  She was given albuterol inhaler.  Reassessment does not show  any significant change in her lung exam.  Patient informs me that she is feeling unwell and would like to stay in the hospital.  We observed her in the ER for longer duration and she continued to have normal vital signs and no hypoxia.  I reassured her further that all of her labs, from organ dysfunction are reassuring.  I also went over the x-ray and CT findings.  She is aware that she does not have any blood clot and that 10 days after the onset of her symptoms, she is likely at a time frame right now where her symptoms will improve.  Unfortunately she is not a candidate for monoclonal antibody as her symptoms have been going on for more than 10 days now. Spoke w/ hospitalist. Dr. Myna Hidalgo said that they can admit her as obs, but really ,she doesn't qualify for any additional treatment.  Patient has been reassessed multiple times. She remains stable from cardiopulm status. She also was ambulated in the room. I will send a message to hospital at home program to see if they can consider admitting patient to them.  The patient appears reasonably screened and/or stabilized for discharge and I doubt any other medical condition or other [facility] requiring further screening, evaluation, or treatment in the ED at this time prior to discharge.   Results from the ER workup discussed with the patient face to face and all questions answered to the best of my ability.  Strict ER return precautions have been discussed, and patient is agreeing with the plan and is comfortable with the workup done and the recommendations from the ER.  [name] was  evaluated in Emergency Department on 12/31/2019 for the symptoms described in the history of present illness. She was evaluated in the context of the global COVID-19 pandemic, which necessitated consideration that the patient might be at risk for infection with the SARS-CoV-2 virus that causes COVID-19. Institutional protocols and algorithms that pertain to the  evaluation of patients at risk for COVID-19 are in a state of rapid change based on information released by regulatory bodies including the CDC and federal and state organizations. These policies and algorithms were followed during the patient's care in the ED.    Final Clinical Impression(s) / ED Diagnoses Final diagnoses:  [id number]    Rx / DC Orders ED Discharge Orders         Ordered    naproxen (NAPROSYN) 375 MG tablet  2 times daily     Discontinue  Reprint     12/30/19 2002    ondansetron (ZOFRAN ODT) 8 MG disintegrating tablet  Every 8 hours PRN     Discontinue  Reprint     12/30/19 2002           Varney Biles, MD 12/30/19 2024    Varney Biles, MD 12/31/19 1536

## 2019-12-30 NOTE — Discharge Instructions (Addendum)
You are seen in the ER for shortness of breath and chest discomfort.  The blood work and the x-ray findings are consistent with COVID-19 pneumonia. There is no drop in oxygen.  There is no organ damage. CT scan is not showing any evidence of blood clots.  Take the medications prescribed.  Return to the ER if your symptoms get worse.

## 2020-01-04 LAB — CULTURE, BLOOD (ROUTINE X 2)
Culture: NO GROWTH
Culture: NO GROWTH
Special Requests: ADEQUATE
Special Requests: ADEQUATE

## 2020-02-22 ENCOUNTER — Ambulatory Visit: Payer: Self-pay | Admitting: Surgery

## 2021-02-03 ENCOUNTER — Emergency Department (HOSPITAL_COMMUNITY): Payer: Medicaid Other

## 2021-02-03 ENCOUNTER — Emergency Department (HOSPITAL_COMMUNITY)
Admission: EM | Admit: 2021-02-03 | Discharge: 2021-02-03 | Disposition: A | Discharge status: Home / Self Care | Payer: Medicaid Other | Attending: Emergency Medicine | Admitting: Emergency Medicine

## 2021-02-03 ENCOUNTER — Other Ambulatory Visit: Payer: Self-pay

## 2021-02-03 ENCOUNTER — Encounter (HOSPITAL_COMMUNITY): Payer: Self-pay

## 2021-02-03 DIAGNOSIS — O208 Other hemorrhage in early pregnancy: Secondary | ICD-10-CM | POA: Diagnosis not present

## 2021-02-03 DIAGNOSIS — J45909 Unspecified asthma, uncomplicated: Secondary | ICD-10-CM | POA: Insufficient documentation

## 2021-02-03 DIAGNOSIS — Z3A01 Less than 8 weeks gestation of pregnancy: Secondary | ICD-10-CM | POA: Insufficient documentation

## 2021-02-03 DIAGNOSIS — O99891 Other specified diseases and conditions complicating pregnancy: Secondary | ICD-10-CM | POA: Diagnosis not present

## 2021-02-03 DIAGNOSIS — Z87891 Personal history of nicotine dependence: Secondary | ICD-10-CM | POA: Diagnosis not present

## 2021-02-03 DIAGNOSIS — R55 Syncope and collapse: Secondary | ICD-10-CM | POA: Insufficient documentation

## 2021-02-03 DIAGNOSIS — R102 Pelvic and perineal pain: Secondary | ICD-10-CM

## 2021-02-03 DIAGNOSIS — O26899 Other specified pregnancy related conditions, unspecified trimester: Secondary | ICD-10-CM

## 2021-02-03 DIAGNOSIS — O468X1 Other antepartum hemorrhage, first trimester: Secondary | ICD-10-CM

## 2021-02-03 DIAGNOSIS — N9489 Other specified conditions associated with female genital organs and menstrual cycle: Secondary | ICD-10-CM | POA: Insufficient documentation

## 2021-02-03 DIAGNOSIS — O418X10 Other specified disorders of amniotic fluid and membranes, first trimester, not applicable or unspecified: Secondary | ICD-10-CM

## 2021-02-03 LAB — COMPREHENSIVE METABOLIC PANEL
ALT: 11 U/L (ref 0–44)
AST: 14 U/L — ABNORMAL LOW (ref 15–41)
Albumin: 3.6 g/dL (ref 3.5–5.0)
Alkaline Phosphatase: 30 U/L — ABNORMAL LOW (ref 38–126)
Anion gap: 9 (ref 5–15)
BUN: 7 mg/dL (ref 6–20)
CO2: 19 mmol/L — ABNORMAL LOW (ref 22–32)
Calcium: 8.8 mg/dL — ABNORMAL LOW (ref 8.9–10.3)
Chloride: 106 mmol/L (ref 98–111)
Creatinine, Ser: 0.89 mg/dL (ref 0.44–1.00)
GFR, Estimated: >60 mL/min (ref >60)
Glucose, Bld: 101 mg/dL — ABNORMAL HIGH (ref 70–99)
Potassium: 3.6 mmol/L (ref 3.5–5.1)
Sodium: 134 mmol/L — ABNORMAL LOW (ref 135–145)
Total Bilirubin: 0.6 mg/dL (ref 0.3–1.2)
Total Protein: 6.2 g/dL — ABNORMAL LOW (ref 6.5–8.1)

## 2021-02-03 LAB — CBC WITH DIFFERENTIAL/PLATELET
Abs Immature Granulocytes: 0.07 10*3/uL (ref 0.00–0.07)
Basophils Absolute: 0 10*3/uL (ref 0.0–0.1)
Basophils Relative: 0 %
Eosinophils Absolute: 0.1 10*3/uL (ref 0.0–0.5)
Eosinophils Relative: 1 %
HCT: 38 % (ref 36.0–46.0)
Hemoglobin: 12.8 g/dL (ref 12.0–15.0)
Immature Granulocytes: 1 %
Lymphocytes Relative: 24 %
Lymphs Abs: 2.1 10*3/uL (ref 0.7–4.0)
MCH: 30.6 pg (ref 26.0–34.0)
MCHC: 33.7 g/dL (ref 30.0–36.0)
MCV: 90.9 fL (ref 80.0–100.0)
Monocytes Absolute: 0.7 10*3/uL (ref 0.1–1.0)
Monocytes Relative: 8 %
Neutro Abs: 5.8 10*3/uL (ref 1.7–7.7)
Neutrophils Relative %: 66 %
Platelets: 254 10*3/uL (ref 150–400)
RBC: 4.18 MIL/uL (ref 3.87–5.11)
RDW: 11.3 % — ABNORMAL LOW (ref 11.5–15.5)
WBC: 8.8 10*3/uL (ref 4.0–10.5)
nRBC: 0 % (ref 0.0–0.2)

## 2021-02-03 LAB — I-STAT BETA HCG BLOOD, ED (MC, WL, AP ONLY): I-stat hCG, quantitative: >2000 m[IU]/mL — ABNORMAL HIGH (ref <5)

## 2021-02-03 LAB — LIPASE, BLOOD: Lipase: 30 U/L (ref 11–51)

## 2021-02-03 NOTE — ED Notes (Signed)
Pt called for vitals, no response. 

## 2021-02-03 NOTE — ED Provider Notes (Signed)
Emergency Medicine Provider Triage Evaluation Note  Jean Ali , a 35 y.o. female  was evaluated in triage.  Pt complains of syncope.  Approximately [redacted] weeks pregnant by dates.  Positive pregnancy test on Friday.  Apparently was feeling unwell today felt lightheaded and had a syncopal episode.  Hit left side of her head.  She subsequently has headache.  No headache prior to syncope.  Is also having some lower abdominal cramping.  States she thinks she also was having some rectal bleeding earlier today with having a bowel movement.  This was reported.  She is unsure if this was rectal versus vaginal bleeding.  No urinary complaints.  Review of Systems  Positive: Positive pregnancy, abdominal pain, syncope, headache, rectal bleed Negative: Urinary symptoms, back pain  Physical Exam  BP 126/80 (BP Location: Right Arm)   Pulse 79   Temp 98.5 F (36.9 C) (Oral)   Resp 17   Ht 5\' 6"  (1.676 m)   Wt 76.2 kg   LMP 12/12/2020 (Approximate)   SpO2 100%   BMI 27.12 kg/m  Gen:   Awake, no distress   Resp:  Normal effort  MSK:   Moves extremities without difficulty  Other:    Medical Decision Making  Medically screening exam initiated at 12:00 PM.  Appropriate orders placed.  Jean Ali was informed that the remainder of the evaluation will be completed by another provider, this initial triage assessment does not replace that evaluation, and the importance of remaining in the ED until their evaluation is complete.  Known pregnancy, syncope, abdominal pain, rectal bleeding  Discussed with [facility]U provider Jean Ali, APP.  Recommends work-up for syncope, rectal bleeding here in ED.  May also get pain work-up here in ED or send to MAU once cleared from a syncope, rectal bleeding standpoint.   Jean Ali A, PA-C 02/03/21 1202    Jean Hidden, MD 02/03/21 [phone]

## 2021-02-03 NOTE — ED Notes (Signed)
Pt found waiting in the pediatric waiting room. Explained to pt that we have been calling their name for vitals with no response. Pt states "you must not have been calling it loudly enough for me to hear". I explained to pt that it would be difficult for an adult pt waiting in the pediatric waiting area to hear names being called from the adult side. Pt explained that she has a right to sit wherever they want and to refuse vitals as well. Explained to pt that we were able to restore their name back on our waiting lis. Pt requesting to speak with charge nurse.

## 2021-02-03 NOTE — Discharge Instructions (Addendum)
Eat small frequent meals. Follow up with your OB. Return to the ER or go to the Women and Childrens unit for pregnancy related concerns.

## 2021-02-03 NOTE — ED Provider Notes (Addendum)
[facility] EMERGENCY DEPARTMENT Provider Note   CSN: [id number] Arrival date & time: 02/03/21  1112     History Chief Complaint  Patient presents with  . Loss of Consciousness    Jean Ali is a 35 y.o. female.  35 year old female presents with complaint of syncope.  Patient states that she is pregnant, LMP December 12, 2020, G5ish P2.  States that she thinks she passed out because she had not had anything to eat today.  Had a similar episode 2 days ago.  Symptoms improve with eating.  Also reports that when she went to the bathroom today she noted a small amount of bright pink blood on the toilet paper, believes this is from a small hemorrhoid that she has, no blood in her underwear, does not believe she is having any vaginal bleeding.  Patient called her OB office with her symptoms today and was advised to come to the ER, has not been seen by OB for this pregnancy as of yet. No other complaints or concerns.       Past Medical History:  Diagnosis Date  . Asthma   . Depression    pp 2006 no meds  . Fibroid   . Hyperemesis gravidarum     Patient Active Problem List   Diagnosis Date Noted  . Subchorionic hematoma in first trimester 02/03/2021  . Preoperative clearance 07/27/2019  . 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 Traumatic tympanic membrane perforation, left, initial encounter 11/27/2018    Past Surgical History:  Procedure Laterality Date  . DILATION AND CURETTAGE OF UTERUS    . THERAPEUTIC ABORTION       OB History     Gravida  6   Para  2   Term  2   Preterm      AB  3   Living  2      SAB  1   IAB  2   Ectopic      Multiple      Live Births  2           Family History  Problem Relation Age of Onset  . Cancer Maternal Grandmother   . Asthma Sister   . Asthma Daughter   . Asthma Maternal Aunt   . Liver cancer Father   . Anesthesia problems Neg Hx   .  Hypotension Neg Hx   . Malignant hyperthermia Neg Hx   . Pseudochol deficiency Neg Hx     Social History   Tobacco Use  . Smoking status: Former    Types: Cigars  . Smokeless tobacco: Never  . Tobacco comments:    occasional Black and Mild  Vaping Use  . Vaping Use: Never used  Substance Use Topics  . Alcohol use: Yes    Comment: occasional  . Drug use: No    Home Medications Prior to Admission medications   Medication Sig Start Date End Date Taking? Authorizing Provider  benzonatate (TESSALON) 100 MG capsule Take 1 capsule (100 mg total) by mouth every 8 (eight) hours. 12/26/19   Recardo Evangelist, PA-C  guaiFENesin (MUCINEX) 600 MG 12 hr tablet Take 600 mg by mouth 2 (two) times daily as needed for cough.    [provider]  medroxyPROGESTERone (DEPO-PROVERA) 150 MG/ML injection INJECT 1 MILLILITERS INTO THE MUSCLE ONCE  AS DIRECTED Patient taking differently: Inject 150 mg into the muscle every 3 (three) months. INJECT 1  MILLILITERS INTO THE MUSCLE ONCE  AS DIRECTED 10/20/17   Shelly Bombard, MD  MELATONIN PO Take 1 tablet by mouth at bedtime as needed (sleep).    [provider]  naproxen (NAPROSYN) 375 MG tablet Take 1 tablet (375 mg total) by mouth 2 (two) times daily. 12/30/19   Varney Biles, MD  ondansetron (ZOFRAN ODT) 8 MG disintegrating tablet Take 1 tablet (8 mg total) by mouth every 8 (eight) hours as needed for nausea. 12/30/19   Varney Biles, MD  ondansetron (ZOFRAN) 4 MG tablet Take 1 tablet (4 mg total) by mouth every 6 (six) hours as needed for nausea or vomiting. 12/23/19   Virgel Manifold, MD  Prenatal Vit-Fe Fumarate-FA (PNV PRENATAL PLUS MULTIVITAMIN) 27-1 MG TABS Take 1 tablet by mouth daily before breakfast. Patient not taking: Reported on 12/26/2019 10/20/17   Shelly Bombard, MD  promethazine (PHENERGAN) 25 MG tablet Take 1 tablet (25 mg total) by mouth every 6 (six) hours as needed for nausea or vomiting. 12/26/19   Recardo Evangelist,  PA-C    Allergies    Acetaminophen and Penicillins  Review of Systems   Review of Systems  Constitutional:  Negative for chills and fever.  Respiratory:  Negative for shortness of breath.   Cardiovascular:  Negative for chest pain.  Gastrointestinal:  Positive for constipation. Negative for abdominal pain, blood in stool, diarrhea, nausea and vomiting.  Genitourinary:  Negative for difficulty urinating, dysuria, vaginal bleeding and vaginal discharge.  Musculoskeletal:  Negative for arthralgias, gait problem and myalgias.  Skin:  Negative for rash and wound.  Allergic/Immunologic: Positive for immunocompromised state.  Neurological:  Positive for syncope. Negative for dizziness, speech difficulty, weakness and headaches.  Hematological:  Does not bruise/bleed easily.  All other systems reviewed and are negative.  Physical Exam Updated Vital Signs BP (!) 111/93   Pulse 92   Temp 98.6 F (37 C) (Oral)   Resp 20   Ht 5\' 6"  (1.676 m)   Wt 76.2 kg   LMP 12/12/2020 (Approximate)   SpO2 99%   BMI 27.12 kg/m   Physical Exam Vitals and nursing note reviewed.  Constitutional:      General: She is not in acute distress.    Appearance: She is well-developed. She is not diaphoretic.  HENT:     Head: Normocephalic and atraumatic.  Eyes:     Conjunctiva/sclera: Conjunctivae normal.  Cardiovascular:     Rate and Rhythm: Normal rate and regular rhythm.     Heart sounds: Normal heart sounds. No murmur heard. Pulmonary:     Effort: Pulmonary effort is normal.     Breath sounds: Normal breath sounds.  Musculoskeletal:     Cervical back: Neck supple.  Skin:    General: Skin is warm and dry.     Findings: No erythema or rash.  Neurological:     Mental Status: She is alert and oriented to person, place, and time.     Sensory: No sensory deficit.     Motor: No weakness.     Gait: Gait normal.  Psychiatric:        Behavior: Behavior normal.    ED Results / Procedures /  Treatments   Labs (all labs ordered are listed, but only abnormal results are displayed) Labs Reviewed  CBC WITH DIFFERENTIAL/PLATELET - Abnormal; Notable for the following components:      Result Value   RDW 11.3 (*)    All other components within normal limits  COMPREHENSIVE METABOLIC  PANEL - Abnormal; Notable for the following components:   Sodium 134 (*)    CO2 19 (*)    Glucose, Bld 101 (*)    Calcium 8.8 (*)    Total Protein 6.2 (*)    AST 14 (*)    Alkaline Phosphatase 30 (*)    All other components within normal limits  I-STAT BETA HCG BLOOD, ED ([facility], [facility], [facility] ONLY) - Abnormal; Notable for the following components:   I-stat hCG, quantitative >2,000.0 (*)    All other components within normal limits  LIPASE, BLOOD  HCG, QUANTITATIVE, PREGNANCY    EKG None  Radiology CT HEAD WO CONTRAST (5MM)  Result Date: 02/03/2021 CLINICAL DATA:  Headache, new or worsening, posttraumatic. Additional history obtained from [place] episode, hitting left side of head. EXAM: CT HEAD WITHOUT CONTRAST TECHNIQUE: Contiguous axial images were obtained from the base of the skull through the vertex without intravenous contrast. COMPARISON:  No pertinent prior exams available for comparison. FINDINGS: Brain: Cerebral volume is normal. There is no acute intracranial hemorrhage. No demarcated cortical infarct. No extra-axial fluid collection. No evidence of an intracranial mass. No midline shift. Vascular: No hyperdense vessel. Skull: Normal. Negative for fracture or focal lesion. Sinuses/Orbits: Visualized orbits show no acute finding. No significant paranasal sinus disease at the imaged levels. IMPRESSION: No evidence of acute intracranial abnormality. Electronically Signed   By: Kellie Simmering D.O.   On: 02/03/2021 13:05   US OB Comp < 14 Wks  Result Date: 02/03/2021 CLINICAL DATA:  Pain, early pregnancy. EXAM: OBSTETRIC <14 WK [place] AND TRANSVAGINAL OB US TECHNIQUE: Both  transabdominal and transvaginal ultrasound examinations were performed for complete evaluation of the gestation as well as the maternal uterus, adnexal regions, and pelvic cul-de-sac. Transvaginal technique was performed to assess early pregnancy. COMPARISON:  None. FINDINGS: Intrauterine gestational sac: Single Yolk sac:  Visualized. Embryo:  Visualized. Cardiac Activity: Visualized. Heart Rate: 136 bpm CRL:  11.25 mm   7 w   2 d                  [place] EDC: 09/18/2021 Subchorionic hemorrhage: Small subchorionic hemorrhage is seen inferior to the gestational sac measuring 1.5 x 1.2 x 0.9 cm. Maternal uterus/adnexae: Maternal ovaries visualized and within normal limits. Cervix is closed. No free fluid. IMPRESSION: 1. Single live intrauterine gestation measuring 7 weeks 2 days by crown-rump length. 2. Small subchorionic hemorrhage. Electronically Signed   By: Ronney Asters M.D.   On: 02/03/2021 19:44   US OB Transvaginal  Result Date: 02/03/2021 CLINICAL DATA:  Pain, early pregnancy. EXAM: OBSTETRIC <14 WK [place] AND TRANSVAGINAL OB US TECHNIQUE: Both transabdominal and transvaginal ultrasound examinations were performed for complete evaluation of the gestation as well as the maternal uterus, adnexal regions, and pelvic cul-de-sac. Transvaginal technique was performed to assess early pregnancy. COMPARISON:  None. FINDINGS: Intrauterine gestational sac: Single Yolk sac:  Visualized. Embryo:  Visualized. Cardiac Activity: Visualized. Heart Rate: 136 bpm CRL:  11.25 mm   7 w   2 d                  [place] EDC: 09/18/2021 Subchorionic hemorrhage: Small subchorionic hemorrhage is seen inferior to the gestational sac measuring 1.5 x 1.2 x 0.9 cm. Maternal uterus/adnexae: Maternal ovaries visualized and within normal limits. Cervix is closed. No free fluid. IMPRESSION: 1. Single live intrauterine gestation measuring 7 weeks 2 days by crown-rump length. 2. Small subchorionic hemorrhage. Electronically Signed   By:  Ronney Asters M.D.    On: 02/03/2021 19:44    Procedures Procedures   Medications Ordered in ED Medications - No data to display  ED Course  I have reviewed the triage vital signs and the nursing notes.  Pertinent labs & imaging results that were available during my care of the patient were reviewed by me and considered in my medical decision making (see chart for details).  Clinical Course as of 02/03/21 2134  Tue Feb 04, 6256  653 35 year old female with complaint of syncope x2 episodes in the past 3 days.  Patient was screened in triage, call to MAU provider at that time who requested patient be evaluated for syncope and her bright red blood on the tissue paper x1 episode to be medically cleared before going to MAU.  Due to extensive wait time, her OB ultrasound was added to rule out ectopic pregnancy. Exam is reassuring, no focal findings.  CBC within normal limits, no evidence of anemia.  CMP without significant electrolyte derangement.  Lipase within normal notes.  Vital stable with room air sat O2 100%.  OB ultrasound shows viable IUP at 7 weeks and 2 days with small subchorionic hemorrhage. Call to Lelan Pons, MAU APP who has reviewed chart, recommends follow-up with patient's OB.  Further review of record, ABO Rh on file O+, does not require RhoGAM. [LM]  2133 CT head obtained in triage due to complaint of syncope with pain to the left side of her face which she states has since resolved.  CT head unremarkable. [LM]    Clinical Course User Index [LM] Roque Lias   MDM Rules/Calculators/A&P                           Final Clinical Impression(s) / ED Diagnoses Final diagnoses:  Syncope, unspecified syncope type  Less than [redacted] weeks gestation of pregnancy  Subchorionic hematoma in first trimester, single or unspecified fetus    Rx / DC Orders ED Discharge Orders     None        Roque Lias 02/03/21 2133    Tacy Learn, PA-C 02/03/21 2134    Valarie Merino,  MD 02/05/21 1325

## 2021-02-03 NOTE — ED Triage Notes (Signed)
Pt arrived c/o syncopal event, hit left side of head Unwitnessed pt states she woke up on floor unsure of how long she was out for  2/10 pain above left eye   Pt states she found out she was pregnant on Friday, LMP in July

## 2021-02-03 NOTE — ED Notes (Signed)
No response x2 for vitals

## 2021-02-14 ENCOUNTER — Other Ambulatory Visit: Payer: Self-pay

## 2021-02-14 ENCOUNTER — Encounter (HOSPITAL_COMMUNITY): Payer: Self-pay | Admitting: Obstetrics and Gynecology

## 2021-02-14 ENCOUNTER — Inpatient Hospital Stay (HOSPITAL_COMMUNITY)
Admission: AD | Admit: 2021-02-14 | Discharge: 2021-02-15 | Disposition: A | Discharge status: Home / Self Care | Payer: Medicaid Other | Attending: Obstetrics and Gynecology | Admitting: Obstetrics and Gynecology

## 2021-02-14 DIAGNOSIS — Z880 Allergy status to penicillin: Secondary | ICD-10-CM | POA: Insufficient documentation

## 2021-02-14 DIAGNOSIS — R1031 Right lower quadrant pain: Secondary | ICD-10-CM | POA: Diagnosis present

## 2021-02-14 DIAGNOSIS — Z87891 Personal history of nicotine dependence: Secondary | ICD-10-CM | POA: Insufficient documentation

## 2021-02-14 DIAGNOSIS — R109 Unspecified abdominal pain: Secondary | ICD-10-CM

## 2021-02-14 DIAGNOSIS — Z3A09 9 weeks gestation of pregnancy: Secondary | ICD-10-CM | POA: Insufficient documentation

## 2021-02-14 DIAGNOSIS — O418X10 Other specified disorders of amniotic fluid and membranes, first trimester, not applicable or unspecified: Secondary | ICD-10-CM

## 2021-02-14 DIAGNOSIS — O208 Other hemorrhage in early pregnancy: Secondary | ICD-10-CM | POA: Diagnosis not present

## 2021-02-14 DIAGNOSIS — O26891 Other specified pregnancy related conditions, first trimester: Secondary | ICD-10-CM | POA: Diagnosis not present

## 2021-02-14 MED ORDER — IBUPROFEN 600 MG PO TABS
600.0000 mg | ORAL_TABLET | Freq: Once | ORAL | Status: AC
  Administered 2021-02-14: 600 mg via ORAL
  Filled 2021-02-14: qty 1

## 2021-02-14 NOTE — MAU Provider Note (Signed)
History     CSN: [id number]  Arrival date and time: 02/14/21 2258   Event Date/Time   First Provider Initiated Contact with Patient 02/14/21 2330      Chief Complaint  Patient presents with   Hip Pain   HPI  [name] is a 35 y.o. female [phone] @ [redacted]w[redacted]d here in MAU with lower abdominal pain that started around 5 pm today. The pain feels like pressure type pain. The pain is worse in the RLQ. She has no fever, no N/V. She was seen at the ED on 9/20 and an IUP was found. She has no bleeding. She has not tried tylenol or anything OTC.   OB History     Gravida  6   Para  2   Term  2   Preterm      AB  3   Living  2      SAB  1   IAB  2   Ectopic      Multiple      Live Births  2           Past Medical History:  Diagnosis Date   Asthma    Depression    pp 2006 no meds   Fibroid    Hyperemesis gravidarum     Past Surgical History:  Procedure Laterality Date   DILATION AND CURETTAGE OF UTERUS     THERAPEUTIC ABORTION      Family History  Problem Relation Age of Onset   Cancer Maternal Grandmother    Asthma Sister    Asthma Daughter    Asthma Maternal Aunt    Liver cancer Father    Anesthesia problems Neg Hx    Hypotension Neg Hx    Malignant hyperthermia Neg Hx    Pseudochol deficiency Neg Hx     Social History   Tobacco Use   Smoking status: Former    Types: Cigars   Smokeless tobacco: Never   Tobacco comments:    occasional Black and Mild  Vaping Use   Vaping Use: Never used  Substance Use Topics   Alcohol use: Yes    Comment: occasional   Drug use: No    Allergies:  Allergies  Allergen Reactions   Acetaminophen Hives    Tolerates Tylox, questionable APAP allergy from youth   Penicillins Hives    Facility-Administered Medications Prior to Admission  Medication Dose Route Frequency Provider Last Rate Last Admin   medroxyPROGESTERone (DEPO-PROVERA) injection 150 mg  150 mg Intramuscular Q90 days  Constant, Peggy, MD   150 mg at 03/28/18 1330   Medications Prior to Admission  Medication Sig Dispense Refill Last Dose   ondansetron (ZOFRAN ODT) 8 MG disintegrating tablet Take 1 tablet (8 mg total) by mouth every 8 (eight) hours as needed for nausea. 20 tablet 0 Past Week   benzonatate (TESSALON) 100 MG capsule Take 1 capsule (100 mg total) by mouth every 8 (eight) hours. (Patient not taking: Reported on 02/14/2021) 21 capsule 0 Not Taking   guaiFENesin (MUCINEX) 600 MG 12 hr tablet Take 600 mg by mouth 2 (two) times daily as needed for cough. (Patient not taking: Reported on 02/14/2021)   Not Taking   medroxyPROGESTERone (DEPO-PROVERA) 150 MG/ML injection INJECT 1 MILLILITERS INTO THE MUSCLE ONCE  AS DIRECTED (Patient taking differently: Inject 150 mg into the muscle every 3 (three) months. INJECT 1 MILLILITERS INTO THE MUSCLE ONCE  AS DIRECTED) 1 mL 4    MELATONIN PO Take  1 tablet by mouth at bedtime as needed (sleep). (Patient not taking: Reported on 02/14/2021)   Not Taking   naproxen (NAPROSYN) 375 MG tablet Take 1 tablet (375 mg total) by mouth 2 (two) times daily. (Patient not taking: Reported on 02/14/2021) 20 tablet 0 Not Taking   ondansetron (ZOFRAN) 4 MG tablet Take 1 tablet (4 mg total) by mouth every 6 (six) hours as needed for nausea or vomiting. 12 tablet 0    Prenatal Vit-Fe Fumarate-FA (PNV PRENATAL PLUS MULTIVITAMIN) 27-1 MG TABS Take 1 tablet by mouth daily before breakfast. (Patient not taking: Reported on 12/26/2019) 30 tablet 11    promethazine (PHENERGAN) 25 MG tablet Take 1 tablet (25 mg total) by mouth every 6 (six) hours as needed for nausea or vomiting. (Patient not taking: Reported on 02/14/2021) 10 tablet 0 Not Taking   Results for orders placed or performed during the hospital encounter of 02/14/21 (from the past 48 hour(s))  Urinalysis, Routine w reflex microscopic Urine, Clean Catch     Status: Abnormal   Collection Time: 02/14/21 12:13 AM  Result Value Ref Range    Color, Urine YELLOW YELLOW   APPearance CLEAR CLEAR   Specific Gravity, Urine 1.019 1.005 - 1.030   pH 5.0 5.0 - 8.0   Glucose, UA NEGATIVE NEGATIVE mg/dL   Hgb urine dipstick NEGATIVE NEGATIVE   Bilirubin Urine NEGATIVE NEGATIVE   Ketones, ur NEGATIVE NEGATIVE mg/dL   Protein, ur NEGATIVE NEGATIVE mg/dL   Nitrite NEGATIVE NEGATIVE   Leukocytes,Ua TRACE (A) NEGATIVE   RBC / HPF 0-5 0 - 5 RBC/hpf   WBC, UA 0-5 0 - 5 WBC/hpf   Bacteria, UA RARE (A) NONE SEEN   Squamous Epithelial / LPF 0-5 0 - 5   Mucus PRESENT     Comment: Performed at [facility], [address], [place], [place] [address]  Wet prep, genital     Status: Abnormal   Collection Time: 02/14/21 11:27 PM   Specimen: Vaginal  Result Value Ref Range   Yeast Wet Prep HPF POC NONE SEEN NONE SEEN   Trich, Wet Prep NONE SEEN NONE SEEN   Clue Cells Wet Prep HPF POC NONE SEEN NONE SEEN   WBC, Wet Prep HPF POC >=10 (A) NONE SEEN   Sperm NONE SEEN     Comment: Performed at [facility], [place] [address], [place], [place] [address]    Review of Systems  Constitutional:  Negative for fever.  Gastrointestinal:  Positive for abdominal pain. Negative for nausea and vomiting.  Genitourinary:  Positive for vaginal discharge. Negative for vaginal bleeding.  Physical Exam   Blood pressure 117/76, pulse 94, temperature 98.6 F (37 C), resp. rate 12, weight 78.5 kg, last menstrual period 12/12/2020, SpO2 99 %.  Physical Exam Constitutional:      General: She is not in acute distress.    Appearance: Normal appearance. She is not ill-appearing, toxic-appearing or diaphoretic.  HENT:     Head: Normocephalic.  Eyes:     Pupils: Pupils are equal, round, and reactive to light.  Abdominal:     Tenderness: There is abdominal tenderness in the right lower quadrant and suprapubic area. There is no guarding or rebound.  Neurological:     Mental Status: She is alert and oriented to person, place, and time.  Psychiatric:         Behavior: Behavior normal.   MAU Course  Procedures  Pt informed that the ultrasound is considered a  limited OB ultrasound and is not intended to be a complete ultrasound exam.  Patient also informed that the ultrasound is not being completed with the intent of assessing for fetal or placental anomalies or any pelvic abnormalities.  Explained that the purpose of today's ultrasound is to assess for  viability.  Patient acknowledges the purpose of the exam and the limitations of the study.   Active fetus noted.   MDM  UA, Wet prep, GC Ibuprofen 600 mg X one dose with a heating pad.  Reports feeling better after one dose of ibuprofen.   Assessment and Plan   A:  1. Abdominal pain in pregnancy, first trimester   2. [redacted] weeks gestation of pregnancy   3. Subchorionic hematoma in first trimester, single or unspecified fetus     P:  Discharge home in stable condition Discussed subchorionic hemorrhage can cause cramping in the abdomen. Return to MAU if symptoms worsen  Victoire Deans, [name] I, NP 02/15/2021 1:01 AM

## 2021-02-14 NOTE — MAU Note (Signed)
Pt unable to give urine sample at this time she states that she just went.

## 2021-02-14 NOTE — MAU Note (Signed)
Pt reports pelvic cramps since 1700.  Pt reports her boobs have been hurting, but is not currently having chest pains.  Denies vaginal bleeding.

## 2021-02-15 LAB — URINALYSIS, ROUTINE W REFLEX MICROSCOPIC
Bilirubin Urine: NEGATIVE
Glucose, UA: NEGATIVE mg/dL
Hgb urine dipstick: NEGATIVE
Ketones, ur: NEGATIVE mg/dL
Nitrite: NEGATIVE
Protein, ur: NEGATIVE mg/dL
Specific Gravity, Urine: 1.019 (ref 1.005–1.030)
pH: 5 (ref 5.0–8.0)

## 2021-02-15 LAB — WET PREP, GENITAL
Clue Cells Wet Prep HPF POC: NONE SEEN
Sperm: NONE SEEN
Trich, Wet Prep: NONE SEEN
WBC, Wet Prep HPF POC: >=10 — AB
Yeast Wet Prep HPF POC: NONE SEEN

## 2021-02-16 LAB — CULTURE, OB URINE

## 2021-02-16 LAB — GC/CHLAMYDIA PROBE AMP (~~LOC~~) NOT AT ARMC
Chlamydia: NEGATIVE
Comment: NEGATIVE
Comment: NORMAL
Neisseria Gonorrhea: NEGATIVE

## 2021-02-25 NOTE — Progress Notes (Unsigned)
OB Intake not complete. Patient states she will be getting care at [place].

## 2021-03-04 ENCOUNTER — Encounter: Payer: Medicaid Other | Admitting: Obstetrics and Gynecology

## 2022-10-07 ENCOUNTER — Ambulatory Visit (HOSPITAL_COMMUNITY)
Admission: EM | Admit: 2022-10-07 | Discharge: 2022-10-07 | Disposition: A | Discharge status: Home / Self Care | Payer: Medicaid Other

## 2022-10-07 ENCOUNTER — Ambulatory Visit (INDEPENDENT_AMBULATORY_CARE_PROVIDER_SITE_OTHER): Payer: Medicaid Other

## 2022-10-07 ENCOUNTER — Encounter (HOSPITAL_COMMUNITY): Payer: Self-pay | Admitting: *Deleted

## 2022-10-07 DIAGNOSIS — R202 Paresthesia of skin: Secondary | ICD-10-CM

## 2022-10-07 DIAGNOSIS — R200 Anesthesia of skin: Secondary | ICD-10-CM

## 2022-10-07 MED ORDER — MELOXICAM 7.5 MG PO TABS: 7.5000 mg | ORAL_TABLET | Freq: Every day | ORAL | 0 refills | Status: AC

## 2022-10-07 NOTE — ED Provider Notes (Signed)
[facility]    CSN: [id number] Arrival date & time: 10/07/22  4098      History   Chief Complaint Chief Complaint  Patient presents with   Numbness    HPI Jean Ali is a 37 y.o. female.   37 year old female presents today primarily due to concerns of numbness to her left great toe on the medial aspect.  She states she was in [place] 2+ months ago and fell getting off the party boat.  Since then, she has noticed that the medial aspect of her entire great toe up until the IP joint is completely numb she denies pain to the toe, and reports full range of motion.  She believes it was swollen and bruised after the fall.  She has not been seen since.  She does here for living and is concerned because she stands on her feet all day. Additionally, patient complains of numbness in her bilateral forearms extending to her hands.  She notices when doing braids with arms extended for prolonged periods of time, she will notice some tingling.  She also reports what feels like an extended tingling sensation with compression to her forearm.  She feels like her forearms cramp.  Massaging it and resting her arms causes the pain to resolve.     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 Authorizing Provider  meloxicam (MOBIC) 7.5 MG tablet Take 1 tablet (7.5 mg total) by mouth daily. 10/07/22 11/06/22 Yes Smith Potenza L, PA  medroxyPROGESTERone Acetate 150 MG/ML SUSY Inject 1 mL into the muscle every 3 (three) months. 08/29/22   [provider]    Family History Family History  Problem Relation Age of Onset   Cancer Maternal Grandmother    Asthma Sister    Asthma Daughter    Asthma Maternal Aunt    Liver cancer Father    Anesthesia problems Neg Hx    Hypotension Neg Hx    Malignant hyperthermia Neg Hx    Pseudochol deficiency Neg Hx     Social History Social History   Tobacco Use   Smoking status: Former    Types: Cigars   Smokeless tobacco: Never   Tobacco comments:    occasional Black and Mild  Vaping Use   Vaping Use: Never used  Substance Use Topics   Alcohol use: Yes  Comment: occasional     Allergies   Acetaminophen and Penicillins   Review of Systems Review of Systems As per HPI  Physical Exam Triage Vital Signs ED Triage Vitals  Enc Vitals Group     BP 10/07/22 1020 121/74     Pulse Rate 10/07/22 1020 82     Resp 10/07/22 1020 18     Temp 10/07/22 1020 97.9 F (36.6 C)     Temp Source 10/07/22 1020 Oral     SpO2 10/07/22 1020 96 %     Weight --      Height --      Head Circumference --      Peak Flow --      Pain Score 10/07/22 1018 0     Pain Loc --      Pain Edu? --      Excl. in GC? --    No data found.  Updated Vital Signs BP 121/74 (BP Location: Right Arm)   Pulse 82   Temp 97.9 F (36.6 C) (Oral)   Resp 18   SpO2 96%   Visual Acuity Right Eye Distance:   Left Eye Distance:   Bilateral Distance:    Right Eye Near:   Left Eye Near:    Bilateral Near:     Physical Exam Vitals and nursing note reviewed.  Constitutional:      General: She is not in acute distress.    Appearance: Normal  appearance. She is normal weight. She is not ill-appearing, toxic-appearing or diaphoretic.  HENT:     Head: Normocephalic and atraumatic.  Cardiovascular:     Rate and Rhythm: Normal rate.  Pulmonary:     Effort: Pulmonary effort is normal. No respiratory distress.  Musculoskeletal:     Right shoulder: Normal. No swelling, deformity, effusion, laceration, tenderness, bony tenderness or crepitus. Normal range of motion. Normal strength. Normal pulse.     Left shoulder: Normal. No swelling, deformity, effusion, laceration, tenderness, bony tenderness or crepitus. Normal range of motion. Normal strength. Normal pulse.     Right upper arm: Normal. No swelling, edema, deformity, lacerations, tenderness or bony tenderness.     Left upper arm: Normal. No swelling, edema, deformity, lacerations, tenderness or bony tenderness.     Right elbow: Normal. No swelling, deformity, effusion or lacerations. Normal range of motion. No tenderness.     Left elbow: Normal. No swelling, deformity, effusion or lacerations. Normal range of motion. No tenderness.     Right forearm: Normal.     Left forearm: Normal.     Right wrist: Normal.     Left wrist: Normal.     Cervical back: Normal range of motion and neck supple. No rigidity.     Right foot: No prominent metatarsal heads.     Left foot: No prominent metatarsal heads.       Feet:  Feet:     Right foot:     Skin integrity: Skin integrity normal. No ulcer, blister, skin breakdown, erythema or warmth.     Left foot:     Skin integrity: Skin integrity normal. No ulcer, blister, skin breakdown, erythema or warmth.  Neurological:     Mental Status: She is alert.      UC Treatments / Results  Labs (all labs ordered are listed, but only abnormal results are displayed) Labs Reviewed - No data to display  EKG   Radiology DG Toe Great Left  Result Date: 10/07/2022 CLINICAL DATA:  numbess to medial  aspect following a toe trauma 2 months ago EXAM:  LEFT GREAT TOE COMPARISON:  None Available. FINDINGS: There is no evidence of fracture or dislocation. There is no evidence of arthropathy or other focal bone abnormality. Soft tissues are unremarkable. IMPRESSION: Negative. Electronically Signed   By: Corlis Leak M.D.   On: 10/07/2022 11:01    Procedures Procedures (including critical care time)  Medications Ordered in UC Medications - No data to display  Initial Impression / Assessment and Plan / UC Course  I have reviewed the triage vital signs and the nursing notes.  Pertinent labs & imaging results that were available during my care of the patient were reviewed by me and considered in my medical decision making (see chart for details).     Numbness -to left medial great toe.  X-ray does not show anything concerning.  She does seem to be double-jointed or possibly have a developing trigger toe.  I recommend she follow-up with podiatry should her symptoms persist or cause concerning symptoms. Paresthesias -in her bilateral upper arms which seem to be compressive in nature and respond with position change.  I will give her a trial of 14 days of meloxicam to see if this helps with her symptoms.   Final Clinical Impressions(s) / UC Diagnoses   Final diagnoses:  Numbness  Paresthesia     Discharge Instructions      Your toe x-ray is normal and does not show any evidence of prior or healing fracture. Given the appearance of the tendon attached to your big toe, I suspect this to be early trigger toe which can cause inflammation to the soft tissue. This may be the cause of your numbness. Additionally, your arm/ hand tingling is likely inflammatory in nature.  I have prescribed you an anti-inflammatory to try for the next 14 days to see if this helps improve your symptoms. If they persist, please call sports medicine (number on this form) to schedule a more comprehensive evaluation.      ED Prescriptions     Medication Sig  Dispense Auth. Provider   meloxicam (MOBIC) 7.5 MG tablet Take 1 tablet (7.5 mg total) by mouth daily. 30 tablet Jullianna Gabor L, [place]      PDMP not reviewed this encounter.   Maretta Bees, [place] 10/07/22 1839

## 2022-10-07 NOTE — Discharge Instructions (Signed)
Your toe x-ray is normal and does not show any evidence of prior or healing fracture. Given the appearance of the tendon attached to your big toe, I suspect this to be early trigger toe which can cause inflammation to the soft tissue. This may be the cause of your numbness. Additionally, your arm/ hand tingling is likely inflammatory in nature.  I have prescribed you an anti-inflammatory to try for the next 14 days to see if this helps improve your symptoms. If they persist, please call sports medicine (number on this form) to schedule a more comprehensive evaluation.

## 2022-10-07 NOTE — ED Triage Notes (Signed)
Pt states she fell off the boat in [place] on 07/23/2022. She states since them her left great toe is numb.   She states her arms go numb at night

## 2023-07-06 IMAGING — CT CT HEAD W/O CM
4 of 8 series · 15 of 47 positions shown, 16 images · non-contrast
Comparison: No pertinent prior exams available for comparison.

CLINICAL DATA: Headache, new or worsening, posttraumatic.
Additional history obtained from electronic medical record: Syncopal
episode, hitting left side of head.

EXAM:
CT HEAD WITHOUT CONTRAST
TECHNIQUE: Contiguous axial images were obtained from the base of the skull
through the vertex without intravenous contrast.

[Series 3: head bone · axial · 0.39mm/px · z∈[-116,-28]mm · 6 of 72 slices shown]
[im 6/72  bone]
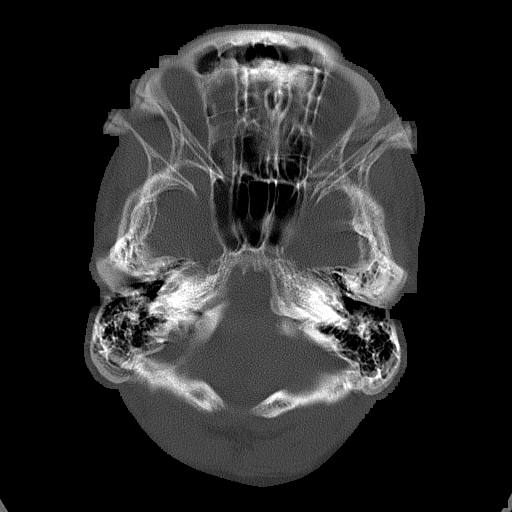
[im 17/72  bone]
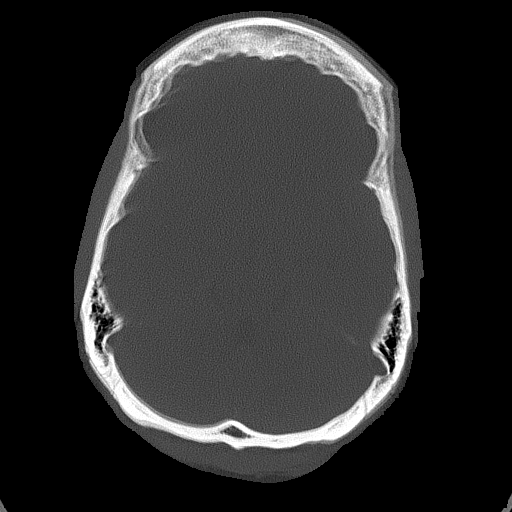
[im 22/72  bone]
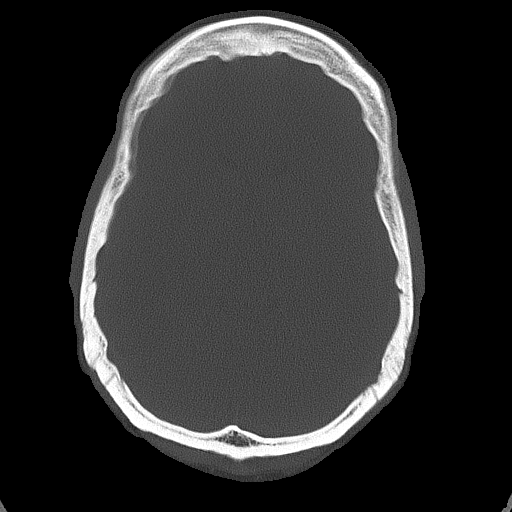
[im 33/72  bone]
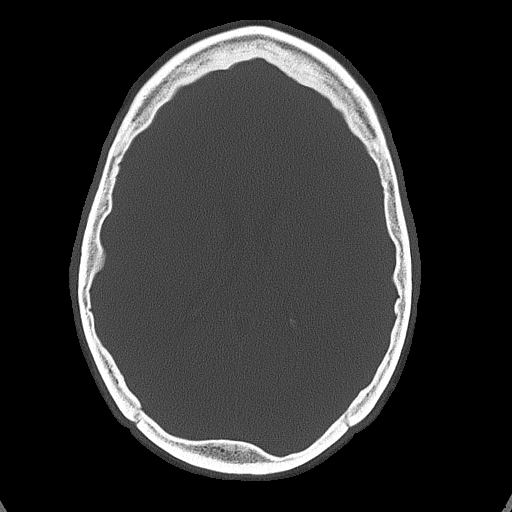
[im 39/72  bone]
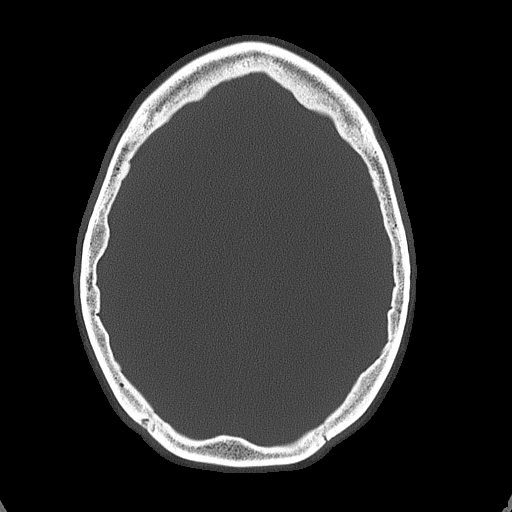
[im 50/72  bone]
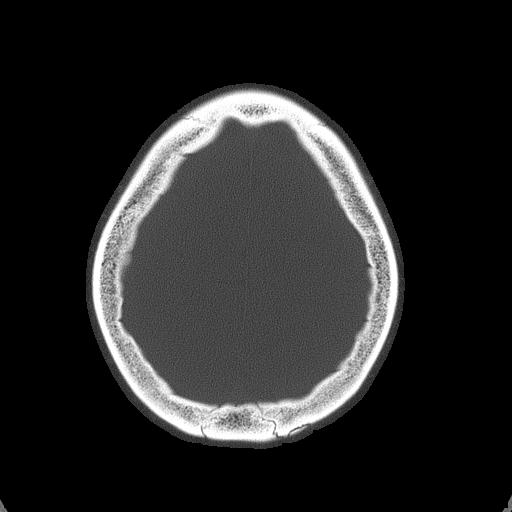

[Series 5: head without · axial · non-contrast · 0.39mm/px · z∈[-101,-16]mm · 4 of 29 slices shown, 5 images]
[im 6/29  brain]
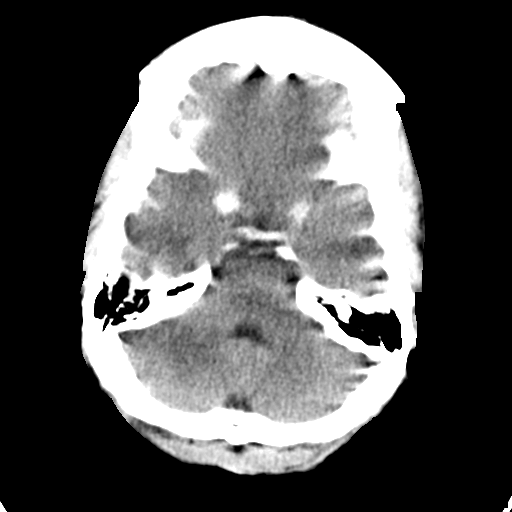
[im 6/29  bone]
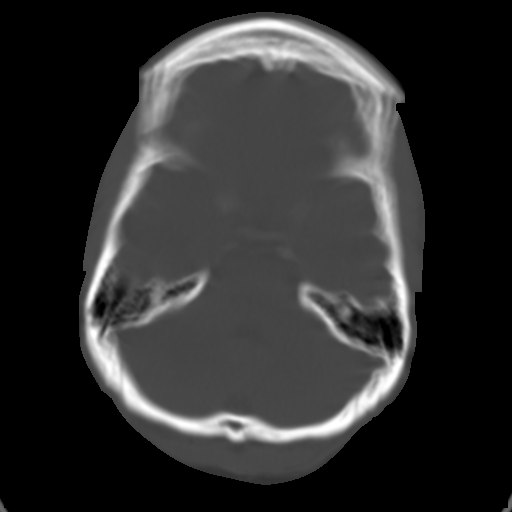
[im 12/29  brain]
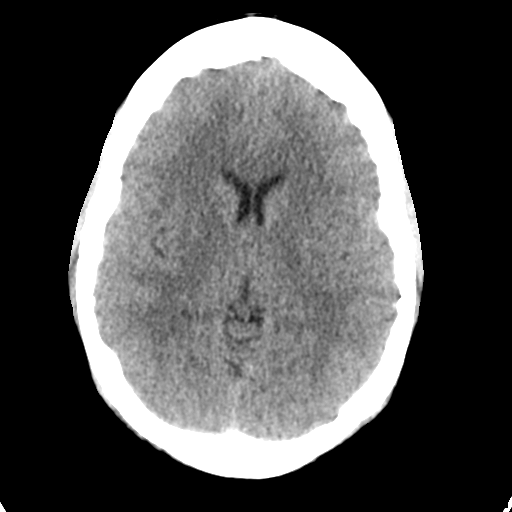
[im 17/29  brain]
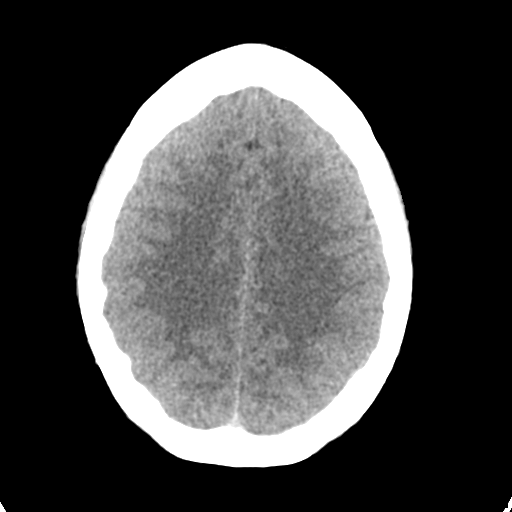
[im 23/29  brain]
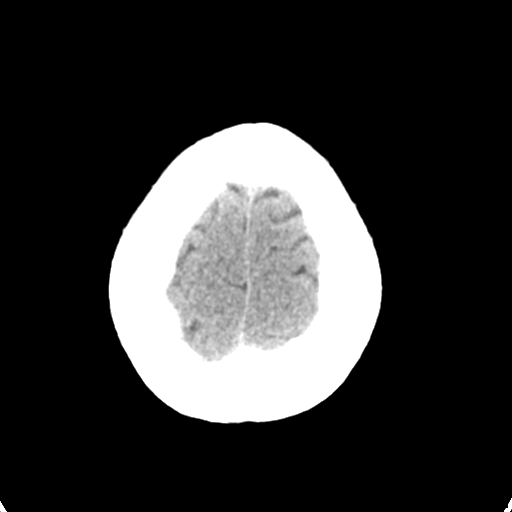

[Series 7: head without cor · coronal · non-contrast · 0.30mm/px · 3 of 62 slices shown]
[im 16/62  brain]
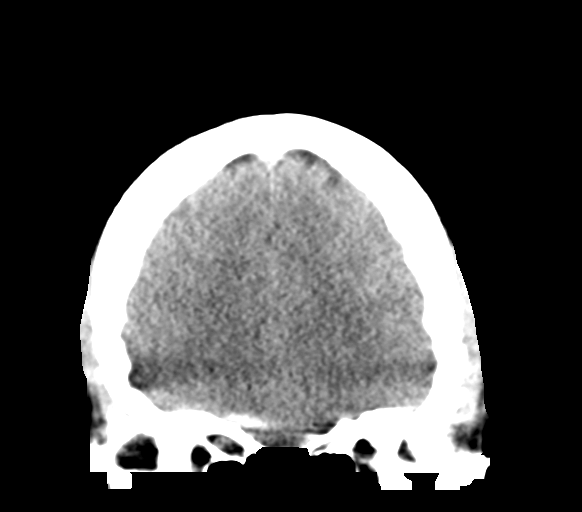
[im 31/62  brain]
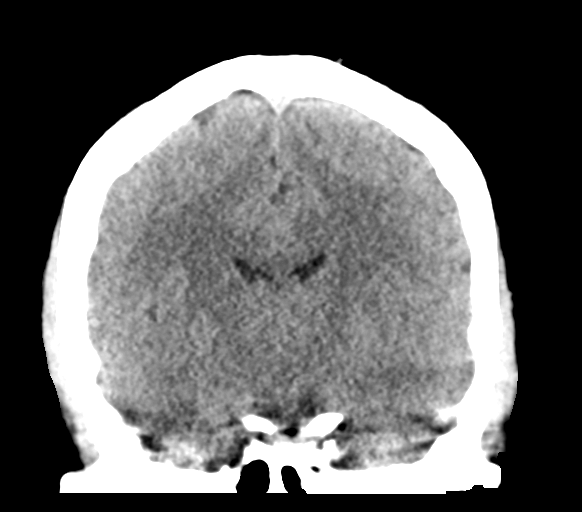
[im 46/62  brain]
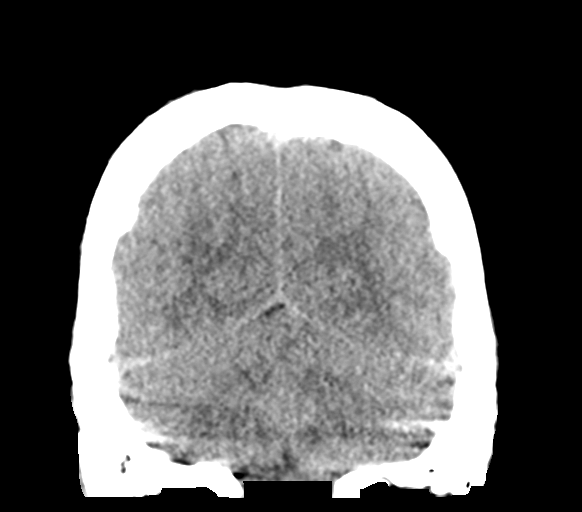

[Series 10: head without sag · sagittal · non-contrast · 0.15mm/px · 2 of 51 slices shown]
[im 17/51  brain]
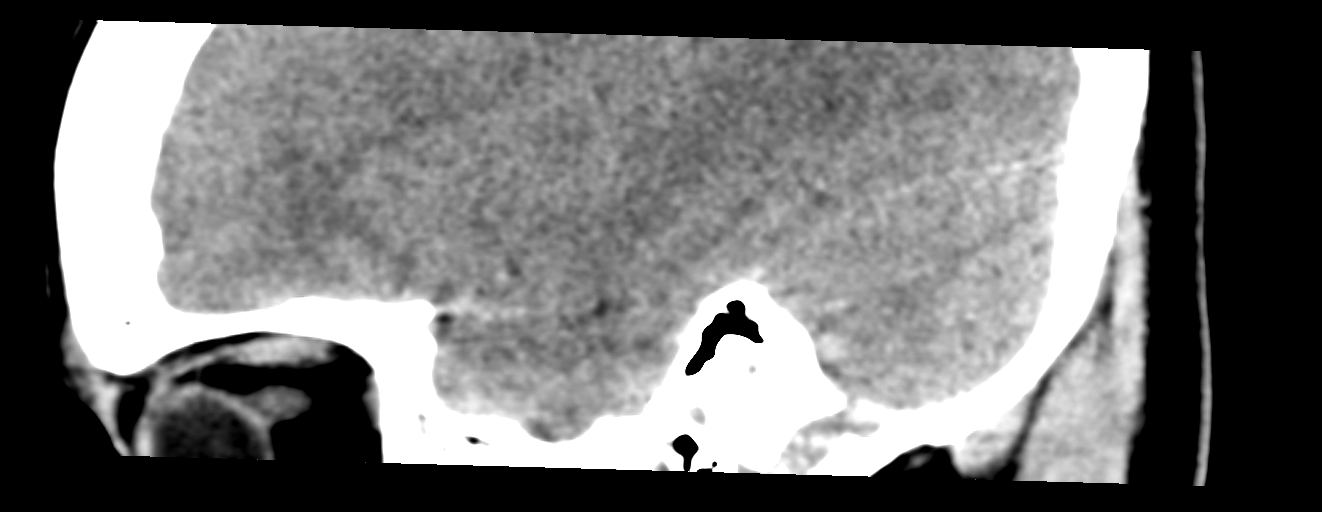
[im 34/51  brain]
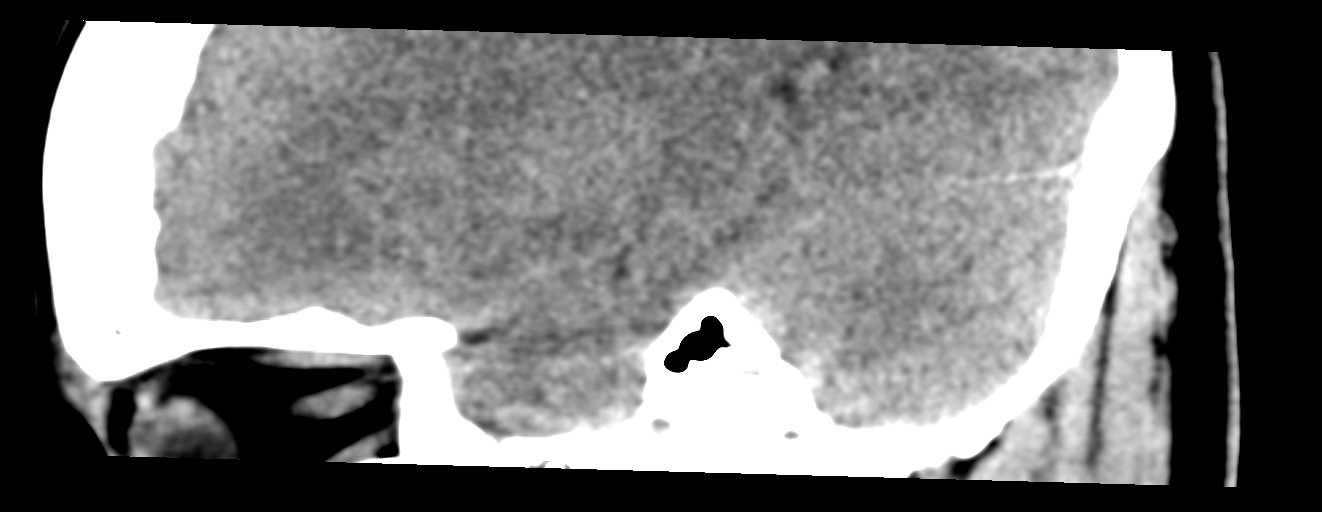

[15 of 47 positions shown; findings below may reference images not displayed]

FINDINGS: Brain:

Cerebral volume is normal.

There is no acute intracranial hemorrhage.

No demarcated cortical infarct.

No extra-axial fluid collection.

No evidence of an intracranial mass.

No midline shift.

Vascular: No hyperdense vessel.

Skull: Normal. Negative for fracture or focal lesion.

Sinuses/Orbits: Visualized orbits show no acute finding. No
significant paranasal sinus disease at the imaged levels.
IMPRESSION: No evidence of acute intracranial abnormality.

## 2023-12-29 NOTE — Progress Notes (Signed)
 This encounter was created in error - please disregard.
